# Patient Record
Sex: Female | Born: 1970 | Race: White | Hispanic: No | Marital: Married | Smoking: Never smoker
Health system: Southern US, Community
[De-identification: ages and names within clinical notes are randomized; demographics above are authoritative.]

## PROBLEM LIST (undated history)

## (undated) DIAGNOSIS — F419 Anxiety disorder, unspecified: Secondary | ICD-10-CM

## (undated) DIAGNOSIS — J189 Pneumonia, unspecified organism: Secondary | ICD-10-CM

## (undated) DIAGNOSIS — G43909 Migraine, unspecified, not intractable, without status migrainosus: Secondary | ICD-10-CM

## (undated) DIAGNOSIS — I10 Essential (primary) hypertension: Secondary | ICD-10-CM

## (undated) HISTORY — DX: Migraine, unspecified, not intractable, without status migrainosus: G43.909

## (undated) HISTORY — DX: Essential (primary) hypertension: I10

---

## 2001-01-24 ENCOUNTER — Other Ambulatory Visit: Admission: RE | Admit: 2001-01-24 | Discharge: 2001-01-24 | Payer: Self-pay | Admitting: Obstetrics and Gynecology

## 2001-06-28 ENCOUNTER — Encounter: Admission: RE | Admit: 2001-06-28 | Discharge: 2001-09-26 | Payer: Self-pay | Admitting: Obstetrics and Gynecology

## 2001-07-27 ENCOUNTER — Inpatient Hospital Stay (HOSPITAL_COMMUNITY): Admission: AD | Admit: 2001-07-27 | Discharge: 2001-07-30 | Payer: Self-pay | Admitting: Obstetrics and Gynecology

## 2001-07-27 ENCOUNTER — Ambulatory Visit (HOSPITAL_COMMUNITY): Admission: RE | Admit: 2001-07-27 | Discharge: 2001-07-27 | Payer: Self-pay | Admitting: Obstetrics and Gynecology

## 2001-09-05 ENCOUNTER — Other Ambulatory Visit: Admission: RE | Admit: 2001-09-05 | Discharge: 2001-09-05 | Payer: Self-pay | Admitting: Obstetrics and Gynecology

## 2002-10-17 ENCOUNTER — Other Ambulatory Visit: Admission: RE | Admit: 2002-10-17 | Discharge: 2002-10-17 | Payer: Self-pay | Admitting: Obstetrics and Gynecology

## 2004-01-23 ENCOUNTER — Other Ambulatory Visit: Admission: RE | Admit: 2004-01-23 | Discharge: 2004-01-23 | Payer: Self-pay | Admitting: Obstetrics and Gynecology

## 2005-03-16 ENCOUNTER — Other Ambulatory Visit: Admission: RE | Admit: 2005-03-16 | Discharge: 2005-03-16 | Payer: Self-pay | Admitting: Obstetrics and Gynecology

## 2007-03-31 ENCOUNTER — Ambulatory Visit: Payer: Self-pay | Admitting: Family Medicine

## 2007-03-31 DIAGNOSIS — G43009 Migraine without aura, not intractable, without status migrainosus: Secondary | ICD-10-CM

## 2007-03-31 DIAGNOSIS — I10 Essential (primary) hypertension: Secondary | ICD-10-CM

## 2007-03-31 DIAGNOSIS — R635 Abnormal weight gain: Secondary | ICD-10-CM

## 2007-04-05 LAB — CONVERTED CEMR LAB
Sodium: 140 meq/L (ref 135–145)
TSH: 1.524 microintl units/mL (ref 0.350–5.50)

## 2007-06-09 ENCOUNTER — Ambulatory Visit: Payer: Self-pay | Admitting: Family Medicine

## 2007-06-09 DIAGNOSIS — M2560 Stiffness of unspecified joint, not elsewhere classified: Secondary | ICD-10-CM

## 2007-09-28 ENCOUNTER — Encounter: Payer: Self-pay | Admitting: Family Medicine

## 2007-09-28 LAB — CONVERTED CEMR LAB
Chloride: 108 meq/L
Creatinine, Ser: 0.9 mg/dL
Glucose, Bld: 95 mg/dL
HDL: 66 mg/dL
LDL Cholesterol: 104 mg/dL
Sodium: 144 meq/L

## 2007-09-29 ENCOUNTER — Ambulatory Visit: Payer: Self-pay | Admitting: Family Medicine

## 2007-10-02 ENCOUNTER — Encounter: Payer: Self-pay | Admitting: Family Medicine

## 2008-05-27 ENCOUNTER — Ambulatory Visit: Payer: Self-pay | Admitting: Family Medicine

## 2008-05-27 DIAGNOSIS — M67919 Unspecified disorder of synovium and tendon, unspecified shoulder: Secondary | ICD-10-CM | POA: Insufficient documentation

## 2008-05-27 DIAGNOSIS — M719 Bursopathy, unspecified: Secondary | ICD-10-CM

## 2009-03-04 ENCOUNTER — Telehealth (INDEPENDENT_AMBULATORY_CARE_PROVIDER_SITE_OTHER): Payer: Self-pay | Admitting: *Deleted

## 2009-04-04 ENCOUNTER — Ambulatory Visit: Payer: Self-pay | Admitting: Family Medicine

## 2009-04-04 DIAGNOSIS — J019 Acute sinusitis, unspecified: Secondary | ICD-10-CM

## 2009-04-26 HISTORY — PX: CHOLECYSTECTOMY: SHX55

## 2009-06-18 ENCOUNTER — Ambulatory Visit: Payer: Self-pay | Admitting: Family Medicine

## 2009-08-06 ENCOUNTER — Ambulatory Visit: Payer: Self-pay | Admitting: Family Medicine

## 2009-08-06 DIAGNOSIS — R1011 Right upper quadrant pain: Secondary | ICD-10-CM

## 2009-08-07 LAB — CONVERTED CEMR LAB
Albumin: 4.6 g/dL (ref 3.5–5.2)
Basophils Absolute: 0 10*3/uL (ref 0.0–0.1)
Basophils Relative: 0 % (ref 0–1)
CO2: 27 meq/L (ref 19–32)
Eosinophils Absolute: 0.1 10*3/uL (ref 0.0–0.7)
HCT: 39.1 % (ref 36.0–46.0)
Hemoglobin: 13.2 g/dL (ref 12.0–15.0)
Lipase: 41 units/L (ref 0–75)
Lymphocytes Relative: 28 % (ref 12–46)
Monocytes Relative: 6 % (ref 3–12)
Neutrophils Relative %: 64 % (ref 43–77)
Potassium: 3.9 meq/L (ref 3.5–5.3)
RBC: 4.15 M/uL (ref 3.87–5.11)
RDW: 12.5 % (ref 11.5–15.5)
Sodium: 139 meq/L (ref 135–145)
Total Bilirubin: 0.1 mg/dL — ABNORMAL LOW (ref 0.3–1.2)

## 2009-08-11 ENCOUNTER — Encounter: Admission: RE | Admit: 2009-08-11 | Discharge: 2009-08-11 | Payer: Self-pay | Admitting: Family Medicine

## 2009-08-13 DIAGNOSIS — K8020 Calculus of gallbladder without cholecystitis without obstruction: Secondary | ICD-10-CM | POA: Insufficient documentation

## 2009-08-14 ENCOUNTER — Encounter: Payer: Self-pay | Admitting: Family Medicine

## 2009-08-25 ENCOUNTER — Telehealth: Payer: Self-pay | Admitting: Family Medicine

## 2009-09-29 ENCOUNTER — Encounter: Payer: Self-pay | Admitting: Family Medicine

## 2010-01-03 ENCOUNTER — Ambulatory Visit: Payer: Self-pay | Admitting: Emergency Medicine

## 2010-05-06 ENCOUNTER — Ambulatory Visit
Admission: RE | Admit: 2010-05-06 | Discharge: 2010-05-06 | Payer: Self-pay | Source: Home / Self Care | Attending: Family Medicine | Admitting: Family Medicine

## 2010-05-06 DIAGNOSIS — J069 Acute upper respiratory infection, unspecified: Secondary | ICD-10-CM | POA: Insufficient documentation

## 2010-05-28 NOTE — Consult Note (Signed)
Summary: [facility]   Imported By: Lanelle Bal 09/15/2009 12:29:36  _____________________________________________________________________  External Attachment:    Type:   Image     Comment:   External Document

## 2010-05-28 NOTE — Assessment & Plan Note (Signed)
Summary: sinusitis   Vital Signs:  Patient profile:   40 year old female Height:      66.25 inches Weight:      181 pounds BMI:     29.10 O2 Sat:      100 % on Room air Temp:     99.1 degrees F oral Pulse rate:   82 / minute BP sitting:   128 / 80  (left arm) Cuff size:   regular  Vitals Entered By: Payton Spark CMA (June 18, 2009 11:41 AM)  O2 Flow:  Room air CC: St, cough, HA, congestion and hoarse x 5 days.    Primary Care Provider:  Seymour Bars DO  CC:  St, cough, HA, and congestion and hoarse x 5 days. Marland Kitchen  History of Present Illness: 40 yo WF presents for HA, sore throat, hoarseness.  She has a lot of head congestion.  No fevers or chills.  She has frontal HAs.  Taking Mucinex DM which helps some.  Her cough is dry.  Feels tired.  Has some nausea but no vomitting or diarrhea.  She has some ear pressure and sinus pressure.    Current Medications (verified): 1)  Enalapril-Hydrochlorothiazide 10-25 Mg  Tabs (Enalapril-Hydrochlorothiazide) .Marland Kitchen.. 1 Tab By Mouth Daily 2)  Maxalt-Mlt 5 Mg  Tbdp (Rizatriptan Benzoate) .Marland Kitchen.. 1 Tab By Mouth X 1 As Needed Migraine Headache; May Repeat in 1 Hr X 1 If Needed 3)  Loestrin 24 Fe 1-20 Mg-Mcg Tabs (Norethin Ace-Eth Estrad-Fe) .... Take 1 Tab By Mouth Once Daily  Allergies (verified): No Known Drug Allergies  Past History:  Past Medical History: Reviewed history from 03/31/2007 and no changes required. migraines HTN G4P2 gyn: Dr Candice Camp  Review of Systems      See HPI  Physical Exam  General:  alert, well-developed, well-nourished, and well-hydrated.   Head:  normocephalic and atraumatic.  maxillary sinuses are TTP Eyes:  conjunctiva clear Ears:  EACs patent; TMs translucent and gray with good cone of light and bony landmarks.  Nose:  deviated septum, nasal congestion Mouth:  good dentition and pharynx pink and moist.   laryngitis Neck:  no masses.   Lungs:  Normal respiratory effort, chest expands symmetrically.  Lungs are clear to auscultation, no crackles or wheezes. Heart:  Normal rate and regular rhythm. S1 and S2 normal without gallop, murmur, click, rub or other extra sounds. Skin:  color normal.   Cervical Nodes:  No lymphadenopathy noted   Impression & Recommendations:  Problem # 1:  SINUSITIS - ACUTE-NOS (ICD-461.9) Treat with 5 days of Zithromax along with OTC cold/ sinus meds short term. Continue supportive care measures and call if not improved after 7 days. Her updated medication list for this problem includes:    Zithromax Z-pak 250 Mg Tabs (Azithromycin) ..... Use as directed  Complete Medication List: 1)  Enalapril-hydrochlorothiazide 10-25 Mg Tabs (Enalapril-hydrochlorothiazide) .Marland Kitchen.. 1 tab by mouth daily 2)  Maxalt-mlt 5 Mg Tbdp (Rizatriptan benzoate) .Marland Kitchen.. 1 tab by mouth x 1 as needed migraine headache; may repeat in 1 hr x 1 if needed 3)  Loestrin 24 Fe 1-20 Mg-mcg Tabs (Norethin ace-eth estrad-fe) .... Take 1 tab by mouth once daily 4)  Zithromax Z-pak 250 Mg Tabs (Azithromycin) .... Use as directed  Patient Instructions: 1)  Call if not improved after 7 days. Prescriptions: ZITHROMAX Z-PAK 250 MG TABS (AZITHROMYCIN) use as directed  #1 pack x 0   Entered and Authorized by:   Seymour Bars DO  Signed by:   Seymour Bars DO on 06/18/2009   Method used:   Electronically to        [facility] [phone]* (retail)       [address] [place], [place]  [address]       Ph: [phone]       Fax: [phone]   RxID:   [id number]

## 2010-05-28 NOTE — Assessment & Plan Note (Signed)
Summary: COugh, HA, fever, sinus pressure x 1 wk rm 5   Vital Signs:  Patient Profile:   40 Years Old Female CC:      Cold & URI symptoms Height:     66.25 inches Weight:      177 pounds O2 Sat:      97 % O2 treatment:    Room Air Temp:     100.7 degrees F oral Pulse rate:   121 / minute Pulse rhythm:   irregular Resp:     16 per minute BP sitting:   125 / 80  (left arm) Cuff size:   regular  Vitals Entered By: Areta Haber CMA (January 03, 2010 11:40 AM)                  Current Allergies: No known allergies History of Present Illness History from: patient Chief Complaint: Cold & URI symptoms History of Present Illness: Patient complains of onset of cold symptoms.  Lasting 1 week.  Husband was sick 2 weeks ago.  Mucinex D helps. No sore throat + cough No pleuritic pain No wheezing + nasal congestion + post-nasal drainage + sinus pain/pressure No itchy/red eyes No earache No hemoptysis No SOB + chills/sweats No nausea No vomiting No abdominal pain No diarrhea No skin rashes No fatigue + myalgias No headache   Current Problems: ACUTE SINUSITIS, UNSPECIFIED (ICD-461.9) GALLSTONES (ICD-574.20) RUQ PAIN (ICD-789.01) SINUSITIS - ACUTE-NOS (ICD-461.9) ROTATOR CUFF SYNDROME, RIGHT (ICD-726.10) NECK STIFFNESS (ICD-719.58) COMMON MIGRAINE (ICD-346.10) WEIGHT GAIN (ICD-783.1) ESSENTIAL HYPERTENSION, BENIGN (ICD-401.1)   Current Meds ENALAPRIL-HYDROCHLOROTHIAZIDE 10-25 MG  TABS (ENALAPRIL-HYDROCHLOROTHIAZIDE) 1 tab by mouth daily MAXALT-MLT 5 MG  TBDP (RIZATRIPTAN BENZOATE) 1 tab by mouth x 1 as needed migraine headache; may repeat in 1 hr x 1 if needed LOESTRIN 24 FE 1-20 MG-MCG TABS (NORETHIN ACE-ETH ESTRAD-FE) take 1 tab by mouth once daily NEXIUM 40 MG CPDR (ESOMEPRAZOLE MAGNESIUM) 1 capsule by mouth daily ALKA-SELTZER PLUS COLD & COUGH 08-25-08-325 MG CAPS (PHENYLEPH-CPM-DM-APAP) as directed MUCINEX D 60-600 MG XR12H-TAB  (PSEUDOEPHEDRINE-GUAIFENESIN) as directed ZITHROMAX Z-PAK 250 MG TABS (AZITHROMYCIN) use as directed  REVIEW OF SYSTEMS Constitutional Symptoms       Complains of fever and chills.     Denies night sweats, weight loss, weight gain, and fatigue.  Eyes       Denies change in vision, eye pain, eye discharge, glasses, contact lenses, and eye surgery. Ear/Nose/Throat/Mouth       Complains of sinus problems and hoarseness.      Denies hearing loss/aids, change in hearing, ear pain, ear discharge, dizziness, frequent runny nose, frequent nose bleeds, sore throat, and tooth pain or bleeding.      Comments: X 1 wk Respiratory       Denies dry cough, productive cough, wheezing, shortness of breath, asthma, bronchitis, and emphysema/COPD.  Cardiovascular       Denies murmurs, chest pain, and tires easily with exhertion.    Gastrointestinal       Denies stomach pain, nausea/vomiting, diarrhea, constipation, blood in bowel movements, and indigestion. Genitourniary       Denies painful urination, kidney stones, and loss of urinary control. Neurological       Complains of headaches.      Denies paralysis, seizures, and fainting/blackouts. Musculoskeletal       Complains of muscle pain and joint stiffness.      Denies joint pain, decreased range of motion, redness, swelling, muscle weakness, and gout.  Skin  Denies bruising, unusual mles/lumps or sores, and hair/skin or nail changes.  Psych       Denies mood changes, temper/anger issues, anxiety/stress, speech problems, depression, and sleep problems. Other Comments: Pt has not seen her PCP for this.   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 Married to [place] and has 2 kids. Never smoked. Occas ETOH Has mirena IUD. 2 coffee /  day. Wants to lose wt and start exercising.  Risk Factors: Smoking Status: never (05/27/2008) Physical Exam General appearance: well developed, well nourished, no acute distress.  Appears slightly fatigued. Ears: normal, no lesions or deformities Nasal: clear discharge, mild erythema Oral/Pharynx: clear PND Neck: neck supple,  trachea midline, no masses Chest/Lungs: no rales, wheezes, or rhonchi bilateral, breath sounds equal without effort Heart: regular rate and  rhythm, no murmur Skin: no obvious rashes or lesions MSE: oriented to time, place, and person Assessment New Problems: ACUTE SINUSITIS, UNSPECIFIED (ICD-461.9)   Patient Education: Patient and/or caregiver instructed in the following: rest, fluids, Tylenol prn, Ibuprofen prn.  Plan New Medications/Changes: ZITHROMAX Z-PAK 250 MG TABS (AZITHROMYCIN) use as directed  #1 x 0, 01/03/2010, Hoyt Koch MD  New Orders: New Patient Level II [phone] Planning Comments:   Continue Mucinex-D for another 3-5 days two times a day  Follow-up with your primary care physician if not improving in another 5-7 days or sooner if getting worse   The patient and/or caregiver has been counseled thoroughly with regard to medications prescribed including dosage, schedule, interactions, rationale for use, and possible side effects and they verbalize understanding.  Diagnoses and expected course of recovery discussed and will return if not improved as expected or if the condition worsens. Patient and/or caregiver verbalized understanding.  Prescriptions: ZITHROMAX Z-PAK 250 MG TABS (AZITHROMYCIN) use as directed  #1 x 0   Entered and Authorized by:   Hoyt Koch MD   Signed by:   Hoyt Koch MD on 01/03/2010   Method used:   Printed then faxed to ...       [facility] [phone]* (retail)       [address] [place], [place]  [address]       Ph: [phone]       Fax: [phone]   RxID:    [id number]   Orders Added: 1)  New Patient Level II [02542]

## 2010-05-28 NOTE — Op Note (Signed)
Summary: Cholecystectomy/[facility]  Cholecystectomy/[facility]   Imported By: Lanelle Bal 10/09/2009 11:17:11  _____________________________________________________________________  External Attachment:    Type:   Image     Comment:   External Document

## 2010-05-28 NOTE — Assessment & Plan Note (Signed)
Summary: GALLSTONES   Vital Signs:  Patient profile:   40 year old female Height:      66.25 inches Weight:      175 pounds BMI:     28.13 O2 Sat:      100 % on Room air Temp:     98.8 degrees F oral Pulse rate:   72 / minute BP sitting:   126 / 72  (left arm) Cuff size:   large  Vitals Entered By: Payton Spark CMA (August 06, 2009 2:54 PM)  O2 Flow:  Room air CC: ? Gallstones. Nausea after eating and RUQ pain x 2 years.    Primary Care Provider:  Seymour Bars DO  CC:  ? Gallstones. Nausea after eating and RUQ pain x 2 years. .  History of Present Illness: 40 yo WF presents for ? gallstones.  Over the past couple years, she has had some postprandial RUQ pain with nausea.  This has been intermittent.  This past wk, she has been having RUQ pain in the mornings.  She did an u/s at work this week.  (She works at a urology office.)  She had some stones in the gall bladder.  Denies any diarrhea or vomitting.  Denies fevers.  Denies any wt loss.      Current Medications (verified): 1)  Enalapril-Hydrochlorothiazide 10-25 Mg  Tabs (Enalapril-Hydrochlorothiazide) .Marland Kitchen.. 1 Tab By Mouth Daily 2)  Maxalt-Mlt 5 Mg  Tbdp (Rizatriptan Benzoate) .Marland Kitchen.. 1 Tab By Mouth X 1 As Needed Migraine Headache; May Repeat in 1 Hr X 1 If Needed 3)  Loestrin 24 Fe 1-20 Mg-Mcg Tabs (Norethin Ace-Eth Estrad-Fe) .... Take 1 Tab By Mouth Once Daily  Allergies (verified): No Known Drug Allergies  Past History:  Past Medical History: Reviewed history from 03/31/2007 and no changes required. migraines HTN G4P2 gyn: Dr Candice Camp  Past Surgical History: Reviewed history from 03/31/2007 and no changes required. none  Social History: Reviewed history from 03/31/2007 and no changes required. 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 Never smoked. Occas ETOH Has mirena IUD. 2 coffee / day. Wants to lose wt and start exercising.  Review of Systems      See  HPI  Physical Exam  General:  alert, well-developed, well-nourished, and well-hydrated.   Head:  normocephalic and atraumatic.   Eyes:  sclera non icteric Mouth:  good dentition and pharynx pink and moist.   Neck:  no masses.   Lungs:  Normal respiratory effort, chest expands symmetrically. Lungs are clear to auscultation, no crackles or wheezes. Heart:  Normal rate and regular rhythm. S1 and S2 normal without gallop, murmur, click, rub or other extra sounds. Abdomen:  + murphys sign with RUQ tenderness and guarding. soft.  ND.   Extremities:  no LE edema Skin:  color normal and no rashes.   Psych:  good eye contact, not anxious appearing, and not depressed appearing.     Impression & Recommendations:  Problem # 1:  RUQ PAIN (ICD-789.01) Likely cholecystitis.  Labs obtained today.  F/U results tomorrow.  Refer to Gen surgery if + given findings on in - office u/s at her workplace.  Avoid fatty greasy foods in the interum.  Start Nexium samples for dyspepsia. Orders: T-Comprehensive Metabolic Panel [id number]) T-CBC w/Diff [id number]) T-Amylase [id number]) T-Lipase [id number])  Complete Medication List: 1)  Enalapril-hydrochlorothiazide 10-25 Mg Tabs (Enalapril-hydrochlorothiazide) .Marland Kitchen.. 1 tab by mouth daily 2)  Maxalt-mlt 5 Mg Tbdp (Rizatriptan benzoate) .Marland KitchenMarland KitchenMarland Kitchen 1  tab by mouth x 1 as needed migraine headache; may repeat in 1 hr x 1 if needed 3)  Loestrin 24 Fe 1-20 Mg-mcg Tabs (Norethin ace-eth estrad-fe) .... Take 1 tab by mouth once daily  Patient Instructions: 1)  LABS TODAY. 2)  AVOID FATTY GREASY FOODS. 3)  WILL CALL YOU W/ RESULTS TOMORROW.

## 2010-05-28 NOTE — Progress Notes (Signed)
Summary: needs Nexium Rx. called in  Phone Note Call from Patient   Caller: Patient Summary of Call: Call patient at [phone] Ext: [phone] Patient was given Nexium samples by Dr.Maisen Klingler and now pt. is requesting a RX. for Nexium. Call her back and let her know the status on this. Thanks, Michaelle Copas Initial call taken by: Michaelle Copas,  Aug 25, 2009 10:20 AM    New/Updated Medications: NEXIUM 40 MG CPDR (ESOMEPRAZOLE MAGNESIUM) 1 capsule by mouth daily Prescriptions: NEXIUM 40 MG CPDR (ESOMEPRAZOLE MAGNESIUM) 1 capsule by mouth daily  #30 x 2   Entered and Authorized by:   Seymour Bars DO   Signed by:   Seymour Bars DO on 08/25/2009   Method used:   Electronically to        [facility] [phone]* (retail)       [address]       [place], [place]  [address]       Ph: [phone]       Fax: [phone]   RxID:   [id number]   Appended Document: needs Nexium Rx. called in Pt aware

## 2010-05-28 NOTE — Assessment & Plan Note (Signed)
Summary: URI   Vital Signs:  Patient profile:   40 year old female Height:      66.25 inches Weight:      180 pounds BMI:     28.94 O2 Sat:      99 % on Room air Temp:     98.5 degrees F oral Pulse rate:   95 / minute BP sitting:   123 / 81  (left arm) Cuff size:   regular  Vitals Entered By: Payton Spark CMA (May 06, 2010 3:48 PM)  O2 Flow:  Room air CC: Cough, congestion, drainage and ST x 4 days.   Primary Care Provider:  Seymour Bars DO  CC:  Cough, congestion, and drainage and ST x 4 days.Marland Kitchen  History of Present Illness: 40 yo WF presents for sore throat, laryngitis, congestion and cough.  Taking Mucinex DM which is helping some.  Denies fevers or chills.  She had her flu shot this year.  Has some sinus pain.  This all started 4 days ago.  HEr cough is mostly dry.  No CP or DOE.  The cough is keeping her up at night.  Denies any GI upset.  No sign contcts.    Current Medications (verified): 1)  Enalapril-Hydrochlorothiazide 10-25 Mg  Tabs (Enalapril-Hydrochlorothiazide) .Marland Kitchen.. 1 Tab By Mouth Daily 2)  Maxalt-Mlt 5 Mg  Tbdp (Rizatriptan Benzoate) .Marland Kitchen.. 1 Tab By Mouth X 1 As Needed Migraine Headache; May Repeat in 1 Hr X 1 If Needed 3)  Nexium 40 Mg Cpdr (Esomeprazole Magnesium) .Marland Kitchen.. 1 Capsule By Mouth Daily  Allergies (verified): No Known Drug Allergies  Past History:  Past Medical History: Reviewed history from 03/31/2007 and no changes required. migraines HTN G4P2 gyn: Dr Candice Camp  Past Surgical History: Reviewed history from 03/31/2007 and no changes required. none  Social History: Reviewed history from 03/31/2007 and no changes required. 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 Never smoked. Occas ETOH Has mirena IUD. 2 coffee / day. Wants to lose wt and start exercising.  Review of Systems      See HPI  Physical Exam  General:  alert, well-developed, well-nourished, and well-hydrated.   Head:   normocephalic and atraumatic.  sinuses NTTP Eyes:  conjunctiva clear Ears:  EACs patent; TMs translucent and gray with good cone of light and bony landmarks.  Nose:  boggy turbinates; clear rhinorrhea Mouth:  o/p slighly injected, clear postnasal drip Neck:  no masses.   Lungs:  Normal respiratory effort, chest expands symmetrically. Lungs are clear to auscultation, no crackles or wheezes. Heart:  Normal rate and regular rhythm. S1 and S2 normal without gallop, murmur, click, rub or other extra sounds. Skin:  color normal.   Cervical Nodes:  shoddy anterior cervical chain LNs Psych:  good eye contact, not anxious appearing, and not depressed appearing.     Impression & Recommendations:  Problem # 1:  VIRAL URI (ICD-465.9) Supportive care for viral URI, day 4.  Added Tussionex at night. The following medications were removed from the medication list:    Alka-seltzer Plus Cold & Cough 08-25-08-325 Mg Caps (Phenyleph-cpm-dm-apap) .Marland Kitchen... As directed    Mucinex D 60-600 Mg Xr12h-tab (Pseudoephedrine-guaifenesin) .Marland Kitchen... As directed Her updated medication list for this problem includes:    Tussionex Pennkinetic Er 10-8 Mg/41ml Lqcr (Hydrocod polst-chlorphen polst) .Marland KitchenMarland KitchenMarland KitchenMarland Kitchen 5 ml by mouth at bedtime as needed cough  Problem # 2:  ESSENTIAL HYPERTENSION, BENIGN (ICD-401.1) BP med RF.  Labs due the end  of April.  She will f/u then.  BP at goal.   Her updated medication list for this problem includes:    Enalapril-hydrochlorothiazide 10-25 Mg Tabs (Enalapril-hydrochlorothiazide) .Marland Kitchen... 1 tab by mouth daily  BP today: 123/81 Prior BP: 125/80 (01/03/2010)  Prior 10 Yr Risk Heart Disease: 2 % (04/04/2009)  Labs Reviewed: K+: 3.9 (08/06/2009) Creat: : 0.80 (08/06/2009)   Chol: 190 (09/28/2007)   HDL: 66 (09/28/2007)   LDL: 104 (09/28/2007)   TG: 115 (09/28/2007)  Complete Medication List: 1)  Enalapril-hydrochlorothiazide 10-25 Mg Tabs (Enalapril-hydrochlorothiazide) .Marland Kitchen.. 1 tab by mouth daily 2)   Maxalt-mlt 5 Mg Tbdp (Rizatriptan benzoate) .Marland Kitchen.. 1 tab by mouth x 1 as needed migraine headache; may repeat in 1 hr x 1 if needed 3)  Nexium 40 Mg Cpdr (Esomeprazole magnesium) .Marland Kitchen.. 1 capsule by mouth daily 4)  Tussionex Pennkinetic Er 10-8 Mg/73ml Lqcr (Hydrocod polst-chlorphen polst) .... 5 ml by mouth at bedtime as needed cough  Patient Instructions: 1)  For cold symptoms: 2)  Use Mucinex DM + Sudafed for cough and congestion during the day. 3)  Instead of the nighttime dose of Mucinex DM, use Tussionex. 4)  Call if not improved after 7 days. Prescriptions: ENALAPRIL-HYDROCHLOROTHIAZIDE 10-25 MG  TABS (ENALAPRIL-HYDROCHLOROTHIAZIDE) 1 tab by mouth daily  #30 Tablet x 2   Entered and Authorized by:   Seymour Bars DO   Signed by:   Seymour Bars DO on 05/06/2010   Method used:   Electronically to        [facility] [phone]* (retail)       [address] [place], [place]  [address]       Ph: [phone]       Fax: [phone]   RxID:   [id number] Sandria Senter ER 10-8 MG/5ML LQCR (HYDROCOD POLST-CHLORPHEN POLST) 5 ml by mouth at bedtime as needed cough  #100 ml x 0   Entered and Authorized by:   Seymour Bars DO   Signed by:   Seymour Bars DO on 05/06/2010   Method used:   Printed then faxed to ...       [facility] [phone]* (retail)       [address] [place], [place]  [address]       Ph: [phone]       Fax: [phone]   RxID:   [id number]    Orders Added: 1)  Est. Patient Level III [32[address]]

## 2010-05-28 NOTE — Letter (Signed)
Summary: Out of Work  [facility]  [address]   [place], [place] [address]   Phone: [phone]  Fax: [phone]    June 18, 2009   Employee:  Darlene Melendez    To Whom It May Concern:   For Medical reasons, please excuse the above named employee from work for the following dates:  Start:   Feb 22 - 23rd  End:   Feb 24th  If you need additional information, please feel free to contact our office.         Sincerely,    Seymour Bars DO

## 2010-07-16 ENCOUNTER — Other Ambulatory Visit: Payer: Self-pay | Admitting: Obstetrics and Gynecology

## 2010-07-16 DIAGNOSIS — R928 Other abnormal and inconclusive findings on diagnostic imaging of breast: Secondary | ICD-10-CM

## 2010-07-23 ENCOUNTER — Ambulatory Visit
Admission: RE | Admit: 2010-07-23 | Discharge: 2010-07-23 | Disposition: A | Discharge status: Home / Self Care | Payer: PRIVATE HEALTH INSURANCE | Source: Ambulatory Visit | Attending: Obstetrics and Gynecology | Admitting: Obstetrics and Gynecology

## 2010-07-23 DIAGNOSIS — R928 Other abnormal and inconclusive findings on diagnostic imaging of breast: Secondary | ICD-10-CM

## 2010-08-26 ENCOUNTER — Telehealth: Payer: Self-pay | Admitting: *Deleted

## 2010-08-26 NOTE — Telephone Encounter (Signed)
Pt would like to have labs done prior to 09/23/09 CPE. Please advise.

## 2010-08-27 NOTE — Telephone Encounter (Signed)
Venice, please arrive pt for LAB visit tomororw and I will print out her lab order.  She can go anytime next wk to the lab fasting to have them drawn.

## 2010-08-28 ENCOUNTER — Other Ambulatory Visit: Payer: PRIVATE HEALTH INSURANCE

## 2010-09-20 ENCOUNTER — Encounter: Payer: Self-pay | Admitting: Family Medicine

## 2010-09-24 ENCOUNTER — Ambulatory Visit (INDEPENDENT_AMBULATORY_CARE_PROVIDER_SITE_OTHER): Payer: PRIVATE HEALTH INSURANCE | Admitting: Family Medicine

## 2010-09-24 ENCOUNTER — Encounter: Payer: Self-pay | Admitting: Family Medicine

## 2010-09-24 DIAGNOSIS — Z13220 Encounter for screening for lipoid disorders: Secondary | ICD-10-CM

## 2010-09-24 DIAGNOSIS — I10 Essential (primary) hypertension: Secondary | ICD-10-CM

## 2010-09-24 DIAGNOSIS — Z130 Encounter for screening for diseases of the blood and blood-forming organs and certain disorders involving the immune mechanism: Secondary | ICD-10-CM

## 2010-09-24 MED ORDER — ENALAPRIL-HYDROCHLOROTHIAZIDE 10-25 MG PO TABS: 1.0000 | ORAL_TABLET | Freq: Every day | ORAL | Status: DC

## 2010-09-24 NOTE — Patient Instructions (Signed)
Update fasting labs. Will call you w/ results on Monday.  Keep up the good work.  Return in 6 mos for f/u.

## 2010-09-24 NOTE — Assessment & Plan Note (Signed)
BP looks great on current meds, RFd today.  Update fasting labs, work on adding regular exercise and return in 6 mos.

## 2010-09-24 NOTE — Progress Notes (Signed)
  Subjective:    Patient ID: Darlene Melendez, female    DOB: [birth year], 40 y.o.   MRN: [id number]  HPI 40 yo WF presents for f/u HTN.  She is due for fasting labs.  She is on enalapril/ HCTZ once daily and is doing well on it w/o problems.  She eats healthy and plans to start exercising.  Denies vision changes, HAs, edema.    BP 113/69  Pulse 76  Resp 20  Ht 5\' 6"  (1.676 m)  Wt 177 lb (80.287 kg)  BMI 28.57 kg/m2  SpO2 98%  Review of Systems as her HPI    Objective:   Physical Exam  Constitutional: She appears well-developed and well-nourished. No distress.  Eyes: Pupils are equal, round, and reactive to light.  Neck: Neck supple. No thyromegaly present.  Cardiovascular: Normal rate, regular rhythm and normal heart sounds.   No murmur heard. Pulmonary/Chest: Effort normal and breath sounds normal.  Musculoskeletal: She exhibits no edema.  Skin: Skin is warm and dry.  Psychiatric: She has a normal mood and affect.          Assessment & Plan:

## 2010-09-25 ENCOUNTER — Telehealth: Payer: Self-pay | Admitting: Family Medicine

## 2010-09-25 LAB — CBC WITH DIFFERENTIAL/PLATELET
Basophils Absolute: 0 10*3/uL (ref 0.0–0.1)
Basophils Relative: 0 % (ref 0–1)
Hemoglobin: 12.4 g/dL (ref 12.0–15.0)
Lymphocytes Relative: 36 % (ref 12–46)
Lymphs Abs: 1.9 10*3/uL (ref 0.7–4.0)
MCHC: 32.5 g/dL (ref 30.0–36.0)
Monocytes Absolute: 0.3 10*3/uL (ref 0.1–1.0)
Neutro Abs: 3 10*3/uL (ref 1.7–7.7)
Neutrophils Relative %: 56 % (ref 43–77)
Platelets: 243 10*3/uL (ref 150–400)
RDW: 13.1 % (ref 11.5–15.5)

## 2010-09-25 LAB — COMPLETE METABOLIC PANEL WITH GFR
AST: 13 U/L (ref 0–37)
Albumin: 4.4 g/dL (ref 3.5–5.2)
Alkaline Phosphatase: 57 U/L (ref 39–117)
Calcium: 9.3 mg/dL (ref 8.4–10.5)
Creat: 0.81 mg/dL (ref 0.40–1.20)
GFR, Est African American: >60 mL/min (ref >60)
Total Bilirubin: 0.4 mg/dL (ref 0.3–1.2)

## 2010-09-25 LAB — LIPID PANEL
Cholesterol: 205 mg/dL — ABNORMAL HIGH (ref 0–200)
HDL: 68 mg/dL (ref >39)
LDL Cholesterol: 111 mg/dL — ABNORMAL HIGH (ref 0–99)
Triglycerides: 130 mg/dL (ref <150)

## 2010-09-25 NOTE — Telephone Encounter (Signed)
Pls let pt know that her blood counts, sugar, liver and kidney function came back normal.  Cholesterol is at goal.  Repeat in 1 yr.

## 2010-09-25 NOTE — Telephone Encounter (Signed)
Pt aware of the above  

## 2010-09-28 ENCOUNTER — Telehealth: Payer: Self-pay | Admitting: Family Medicine

## 2010-09-28 MED ORDER — ENALAPRIL-HYDROCHLOROTHIAZIDE 10-25 MG PO TABS: 1.0000 | ORAL_TABLET | Freq: Every day | ORAL | Status: DC

## 2010-09-28 NOTE — Telephone Encounter (Signed)
Resent RX

## 2010-12-07 ENCOUNTER — Other Ambulatory Visit: Payer: Self-pay | Admitting: *Deleted

## 2010-12-07 MED ORDER — ENALAPRIL-HYDROCHLOROTHIAZIDE 10-25 MG PO TABS: 1.0000 | ORAL_TABLET | Freq: Every day | ORAL | Status: DC

## 2010-12-24 ENCOUNTER — Other Ambulatory Visit: Payer: Self-pay | Admitting: Obstetrics and Gynecology

## 2010-12-24 DIAGNOSIS — N6310 Unspecified lump in the right breast, unspecified quadrant: Secondary | ICD-10-CM

## 2011-01-18 ENCOUNTER — Ambulatory Visit
Admission: RE | Admit: 2011-01-18 | Discharge: 2011-01-18 | Disposition: A | Discharge status: Home / Self Care | Payer: PRIVATE HEALTH INSURANCE | Source: Ambulatory Visit | Attending: Obstetrics and Gynecology | Admitting: Obstetrics and Gynecology

## 2011-01-18 DIAGNOSIS — N6310 Unspecified lump in the right breast, unspecified quadrant: Secondary | ICD-10-CM

## 2011-06-14 ENCOUNTER — Encounter: Payer: Self-pay | Admitting: *Deleted

## 2011-06-14 ENCOUNTER — Emergency Department (INDEPENDENT_AMBULATORY_CARE_PROVIDER_SITE_OTHER)
Admission: EM | Admit: 2011-06-14 | Discharge: 2011-06-14 | Disposition: A | Discharge status: Home Health | Payer: PRIVATE HEALTH INSURANCE | Source: Home / Self Care | Attending: Emergency Medicine | Admitting: Emergency Medicine

## 2011-06-14 DIAGNOSIS — J329 Chronic sinusitis, unspecified: Secondary | ICD-10-CM

## 2011-06-14 DIAGNOSIS — J069 Acute upper respiratory infection, unspecified: Secondary | ICD-10-CM

## 2011-06-14 HISTORY — DX: Anxiety disorder, unspecified: F41.9

## 2011-06-14 MED ORDER — AMOXICILLIN-POT CLAVULANATE 875-125 MG PO TABS: 1.0000 | ORAL_TABLET | Freq: Two times a day (BID) | ORAL | Status: AC

## 2011-06-14 NOTE — ED Notes (Signed)
Patient c/o productive cough, sinus pain and drainage x 4 weeks. Taken mucinex and alka seltzer OTC.

## 2011-06-14 NOTE — ED Provider Notes (Signed)
History     CSN: [id number]  Arrival date & time 06/14/11  1609   First MD Initiated Contact with Patient 06/14/11 1611      Chief Complaint  Patient presents with  . Sinusitis  . Cough    (Consider location/radiation/quality/duration/timing/severity/associated sxs/prior treatment) HPI Darlene Melendez is a 41 y.o. female who complains of onset of cold symptoms for 4 weeks.  No sore throat No cough No pleuritic pain No wheezing + nasal congestion + post-nasal drainage + sinus pain/pressure No chest congestion No itchy/red eyes No earache No hemoptysis No SOB No chills/sweats + fever No nausea No vomiting No abdominal pain No diarrhea No skin rashes No fatigue No myalgias + headache    Past Medical History  Diagnosis Date  . Migraines   . Hypertension   . Anxiety     Past Surgical History  Procedure Date  . Cholecystectomy 2011    Family History  Problem Relation Age of Onset  . Hypertension Mother   . Hypertension Father   . Diabetes Father   . Cancer Father     History  Substance Use Topics  . Smoking status: Never Smoker   . Smokeless tobacco: Not on file  . Alcohol Use: Yes     occasional    OB History    Grav Para Term Preterm Abortions TAB SAB Ect Mult Living                  Review of Systems  All other systems reviewed and are negative.    Allergies  Review of patient's allergies indicates no known allergies.  Home Medications   Current Outpatient Rx  Name Route Sig Dispense Refill  . ALPRAZOLAM 0.25 MG PO TABS Oral Take 0.25 mg by mouth at bedtime as needed.      . AMOXICILLIN-POT CLAVULANATE 875-125 MG PO TABS Oral Take 1 tablet by mouth 2 (two) times daily. 20 tablet 0  . HYDROCOD POLST-CPM POLST ER 10-8 MG/5ML PO LQCR Oral Take 5 mLs by mouth at bedtime as needed. For cough.     . ENALAPRIL-HYDROCHLOROTHIAZIDE 10-25 MG PO TABS Oral Take 1 tablet by mouth daily. 90 tablet 3  . ESCITALOPRAM OXALATE 10 MG PO TABS Oral Take 10  mg by mouth daily.      Marland Kitchen ESOMEPRAZOLE MAGNESIUM 40 MG PO CPDR Oral Take 40 mg by mouth daily before breakfast.      . ESTRADIOL 2 MG PO TABS Oral Take 2 mg by mouth daily.      Marland Kitchen RIZATRIPTAN BENZOATE 5 MG PO TBDP Oral Take 5 mg by mouth as needed. May repeat in 1-2 hours if needed if no relief from headache.       BP 118/79  Pulse 80  Temp(Src) 99.4 F (37.4 C) (Oral)  Resp 14  Ht 5\' 6"  (1.676 m)  Wt 185 lb (83.915 kg)  BMI 29.86 kg/m2  SpO2 99%  Physical Exam  Nursing note and vitals reviewed. Constitutional: She is oriented to person, place, and time. She appears well-developed and well-nourished.  HENT:  Head: Normocephalic and atraumatic.  Right Ear: Tympanic membrane, external ear and ear canal normal.  Left Ear: Tympanic membrane, external ear and ear canal normal.  Nose: Mucosal edema and rhinorrhea present. Right sinus exhibits maxillary sinus tenderness. Left sinus exhibits maxillary sinus tenderness.  Mouth/Throat: No oropharyngeal exudate, posterior oropharyngeal edema or posterior oropharyngeal erythema.  Eyes: No scleral icterus.  Neck: Neck supple.  Cardiovascular: Regular rhythm and normal heart  sounds.   Pulmonary/Chest: Effort normal and breath sounds normal. No respiratory distress.  Neurological: She is alert and oriented to person, place, and time.  Skin: Skin is warm and dry.  Psychiatric: She has a normal mood and affect. Her speech is normal.    ED Course  Procedures (including critical care time)  Labs Reviewed - No data to display No results found.   1. Acute upper respiratory infections of unspecified site   2. Sinusitis       MDM  1)  Take the prescribed antibiotic as instructed. 2)  Use nasal saline solution (over the counter) at least 3 times a day. 3)  Use over the counter decongestants like Zyrtec-D every 12 hours as needed to help with congestion.  If you have hypertension, do not take medicines with sudafed.  4)  Can take tylenol  every 6 hours or motrin every 8 hours for pain or fever. 5)  Follow up with your primary doctor if no improvement in 5-7 days, sooner if increasing pain, fever, or new symptoms.      Lily Kocher, MD 06/14/11 [phone]

## 2011-07-22 ENCOUNTER — Other Ambulatory Visit: Payer: Self-pay | Admitting: Obstetrics and Gynecology

## 2011-07-22 DIAGNOSIS — Z1231 Encounter for screening mammogram for malignant neoplasm of breast: Secondary | ICD-10-CM

## 2011-08-05 ENCOUNTER — Ambulatory Visit
Admission: RE | Admit: 2011-08-05 | Discharge: 2011-08-05 | Disposition: A | Discharge status: Home / Self Care | Payer: PRIVATE HEALTH INSURANCE | Source: Ambulatory Visit | Attending: Obstetrics and Gynecology | Admitting: Obstetrics and Gynecology

## 2011-08-05 DIAGNOSIS — Z1231 Encounter for screening mammogram for malignant neoplasm of breast: Secondary | ICD-10-CM

## 2011-08-09 ENCOUNTER — Other Ambulatory Visit: Payer: Self-pay | Admitting: Obstetrics and Gynecology

## 2011-08-09 DIAGNOSIS — R928 Other abnormal and inconclusive findings on diagnostic imaging of breast: Secondary | ICD-10-CM

## 2011-08-11 ENCOUNTER — Ambulatory Visit
Admission: RE | Admit: 2011-08-11 | Discharge: 2011-08-11 | Disposition: A | Discharge status: Home / Self Care | Payer: PRIVATE HEALTH INSURANCE | Source: Ambulatory Visit | Attending: Obstetrics and Gynecology | Admitting: Obstetrics and Gynecology

## 2011-08-11 DIAGNOSIS — R928 Other abnormal and inconclusive findings on diagnostic imaging of breast: Secondary | ICD-10-CM

## 2011-09-15 ENCOUNTER — Other Ambulatory Visit: Payer: Self-pay | Admitting: Obstetrics and Gynecology

## 2011-09-29 ENCOUNTER — Encounter: Payer: Self-pay | Admitting: Physician Assistant

## 2011-09-29 ENCOUNTER — Ambulatory Visit (INDEPENDENT_AMBULATORY_CARE_PROVIDER_SITE_OTHER): Payer: PRIVATE HEALTH INSURANCE | Admitting: Physician Assistant

## 2011-09-29 VITALS — BP 131/86 | HR 73 | Temp 98.3°F | Ht 66.0 in | Wt 183.0 lb

## 2011-09-29 DIAGNOSIS — G43009 Migraine without aura, not intractable, without status migrainosus: Secondary | ICD-10-CM

## 2011-09-29 MED ORDER — SUMATRIPTAN SUCCINATE 100 MG PO TABS: 100.0000 mg | ORAL_TABLET | ORAL | Status: DC | PRN

## 2011-09-29 NOTE — Patient Instructions (Addendum)
Gave Toradol in office. Continue to take Zofran for nausea. Will give prescription for Imitrex since not tried. Take at onset and then can take another pill 2 hr later if migraine still present. No more more 2 doses in 24 hrs. Make sure to stay hydrated.   Migraine Headache A migraine headache is an intense, throbbing pain on one or both sides of your head. The exact cause of a migraine headache is not always known. A migraine may be caused when nerves in the brain become irritated and release chemicals that cause swelling within blood vessels, causing pain. Many migraine sufferers have a family history of migraines. Before you get a migraine you may or may not get an aura. An aura is a group of symptoms that can predict the beginning of a migraine. An aura may include:  Visual changes such as:   Flashing lights.   Bright spots or zig-zag lines.   Tunnel vision.   Feelings of numbness.   Trouble talking.   Muscle weakness.  SYMPTOMS  Pain on one or both sides of your head.   Pain that is pulsating or throbbing in nature.   Pain that is severe enough to prevent daily activities.   Pain that is aggravated by any daily physical activity.   Nausea (feeling sick to your stomach), vomiting, or both.   Pain with exposure to bright lights, loud noises, or activity.   General sensitivity to bright lights or loud noises.  MIGRAINE TRIGGERS Examples of triggers of migraine headaches include:   Alcohol.   Smoking.   Stress.   It may be related to menses (female menstruation).   Aged cheeses.   Foods or drinks that contain nitrates, glutamate, aspartame, or tyramine.   Lack of sleep.   Chocolate.   Caffeine.   Hunger.   Medications such as nitroglycerine (used to treat chest pain), birth control pills, estrogen, and some blood pressure medications.  DIAGNOSIS  A migraine headache is often diagnosed based on:  Symptoms.   Physical examination.   A computerized X-ray  scan (computed tomography, CT) of your head.  TREATMENT  Medications can help prevent migraines if they are recurrent or should they become recurrent. Your caregiver can help you with a medication or treatment program that will be helpful to you.   Lying down in a dark, quiet room may be helpful.   Keeping a headache diary may help you find a trend as to what may be triggering your headaches.  SEEK IMMEDIATE MEDICAL CARE IF:   You have confusion, personality changes or seizures.   You have headaches that wake you from sleep.   You have an increased frequency in your headaches.   You have a stiff neck.   You have a loss of vision.   You have muscle weakness.   You start losing your balance or have trouble walking.   You feel faint or pass out.  MAKE SURE YOU:   Understand these instructions.   Will watch your condition.   Will get help right away if you are not doing well or get worse.  Document Released: 04/12/2005 Document Revised: 04/01/2011 Document Reviewed: 11/26/2008 [facility] Patient Information 2012 [place], [place].

## 2011-09-29 NOTE — Progress Notes (Signed)
  Subjective:    Patient ID: Darlene Melendez, female    DOB: 1970-09-04, 41 y.o.   MRN: [id number]  HPI Darlene Melendez to ER on Saturday night for migraine. CT and LP were negative. She had reglan in her IV. Her migraine has still not resolved. She does not have any meds for migraines. She used to used maxalt but states that it really doesn't work. She has not tried anything else. Usually excedrin migraine to keep off migraines. Not worked this time. Denies auras. Pain is frontal and behind both eyes. She is very sensitive to light. Very nauseated but no vomiting. She has been using Zofran and it is helping.     Review of Systems     Objective:   Physical Exam  Constitutional: She is oriented to person, place, and time. She appears well-developed and well-nourished.  HENT:  Head: Normocephalic and atraumatic.       Pain with palpation in frontal region of head.  Cardiovascular: Normal rate, regular rhythm and normal heart sounds.   Pulmonary/Chest: Effort normal and breath sounds normal.  Neurological: She is alert and oriented to person, place, and time.  Skin: Skin is warm and dry.  Psychiatric: She has a normal mood and affect. Her behavior is normal.          Assessment & Plan:  Common Migraine-Gave Toradol in office. Continue to take Zofran for nausea. Will give prescription for Imitrex since not tried. Take at onset and then can take another pill 2 hr later if migraine still present. No more more 2 doses in 24 hrs. Make sure to stay hydrated.

## 2011-12-22 ENCOUNTER — Other Ambulatory Visit: Payer: Self-pay | Admitting: *Deleted

## 2011-12-22 MED ORDER — ENALAPRIL-HYDROCHLOROTHIAZIDE 10-25 MG PO TABS: 1.0000 | ORAL_TABLET | Freq: Every day | ORAL | Status: DC

## 2012-03-24 ENCOUNTER — Other Ambulatory Visit: Payer: Self-pay | Admitting: Physician Assistant

## 2012-05-01 ENCOUNTER — Other Ambulatory Visit: Payer: Self-pay | Admitting: Physician Assistant

## 2012-05-01 NOTE — Telephone Encounter (Signed)
Needs appointment

## 2012-07-15 ENCOUNTER — Other Ambulatory Visit: Payer: Self-pay | Admitting: Physician Assistant

## 2012-08-04 ENCOUNTER — Ambulatory Visit (INDEPENDENT_AMBULATORY_CARE_PROVIDER_SITE_OTHER): Payer: PRIVATE HEALTH INSURANCE | Admitting: Physician Assistant

## 2012-08-04 ENCOUNTER — Ambulatory Visit: Payer: PRIVATE HEALTH INSURANCE | Admitting: Physician Assistant

## 2012-08-04 ENCOUNTER — Encounter: Payer: Self-pay | Admitting: Physician Assistant

## 2012-08-04 VITALS — BP 116/74 | HR 64 | Wt 190.0 lb

## 2012-08-04 DIAGNOSIS — R5383 Other fatigue: Secondary | ICD-10-CM

## 2012-08-04 DIAGNOSIS — G43009 Migraine without aura, not intractable, without status migrainosus: Secondary | ICD-10-CM

## 2012-08-04 DIAGNOSIS — I10 Essential (primary) hypertension: Secondary | ICD-10-CM

## 2012-08-04 DIAGNOSIS — Z131 Encounter for screening for diabetes mellitus: Secondary | ICD-10-CM

## 2012-08-04 DIAGNOSIS — Z13220 Encounter for screening for lipoid disorders: Secondary | ICD-10-CM

## 2012-08-04 LAB — LIPID PANEL
HDL: 55 mg/dL (ref >39)
LDL Cholesterol: 150 mg/dL — ABNORMAL HIGH (ref 0–99)
Triglycerides: 184 mg/dL — ABNORMAL HIGH (ref <150)
VLDL: 37 mg/dL (ref 0–40)

## 2012-08-04 LAB — CBC
MCV: 90.1 fL (ref 78.0–100.0)
Platelets: 240 10*3/uL (ref 150–400)
RBC: 4.15 MIL/uL (ref 3.87–5.11)
WBC: 5.5 10*3/uL (ref 4.0–10.5)

## 2012-08-04 LAB — COMPREHENSIVE METABOLIC PANEL
ALT: 21 U/L (ref 0–35)
CO2: 31 mEq/L (ref 19–32)
Creat: 0.82 mg/dL (ref 0.50–1.10)
Total Bilirubin: 0.5 mg/dL (ref 0.3–1.2)

## 2012-08-04 LAB — VITAMIN B12: Vitamin B-12: 403 pg/mL (ref 211–911)

## 2012-08-04 LAB — TSH: TSH: 1.531 u[IU]/mL (ref 0.350–4.500)

## 2012-08-04 MED ORDER — ENALAPRIL-HYDROCHLOROTHIAZIDE 10-25 MG PO TABS: 1.0000 | ORAL_TABLET | Freq: Every day | ORAL | Status: DC

## 2012-08-04 MED ORDER — SUMATRIPTAN SUCCINATE 100 MG PO TABS: 100.0000 mg | ORAL_TABLET | ORAL | Status: DC | PRN

## 2012-08-05 LAB — VITAMIN D 25 HYDROXY (VIT D DEFICIENCY, FRACTURES): Vit D, 25-Hydroxy: 33 ng/mL (ref 30–89)

## 2012-08-06 NOTE — Progress Notes (Signed)
  Subjective:    Patient ID: Darlene Melendez, female    DOB: 05-03-1970, 42 y.o.   MRN: [id number]  Hypertension This is a chronic problem. The current episode started more than 1 year ago. The problem is unchanged. The problem is controlled. Pertinent negatives include no anxiety, blurred vision, chest pain, headaches, malaise/fatigue, neck pain, orthopnea, palpitations, peripheral edema, PND, shortness of breath or sweats. There are no associated agents to hypertension. Risk factors for coronary artery disease include stress. Past treatments include ACE inhibitors and diuretics. The current treatment provides significant improvement. Compliance problems include exercise.  There is no history of angina or heart failure.   She has had more stress in her life recently but has felt more fatigued. She would like vitamins checked for a full work up. She states she sleeps fairly well at night and does not have problems getting to sleep. Denies heavy periods. She does not eat a lot of meat or foods with iron. She does not take MVI.  Migraines well controlled with Imitrex as needed. On average migraine once every 2 months.   Review of Systems  Constitutional: Negative for malaise/fatigue.  HENT: Negative for neck pain.   Eyes: Negative for blurred vision.  Respiratory: Negative for shortness of breath.   Cardiovascular: Negative for chest pain, palpitations, orthopnea and PND.  Neurological: Negative for headaches.       Objective:   Physical Exam  Constitutional: She is oriented to person, place, and time. She appears well-developed and well-nourished.  HENT:  Head: Normocephalic and atraumatic.  Neck: Normal range of motion. Neck supple.  Cardiovascular: Normal rate, regular rhythm and normal heart sounds.   Pulmonary/Chest: Effort normal and breath sounds normal.  Neurological: She is alert and oriented to person, place, and time.  Skin: Skin is warm and dry.  Psychiatric: She has a normal  mood and affect. Her behavior is normal.          Assessment & Plan:  HTN- Refilled meds. Controlled. Reminded of low salt diet and weight loss.  Migraine- Refilled imitrex.  Screening labs and work up for fatigue were given for patient to get drawn and follow up with CPE.

## 2013-06-25 ENCOUNTER — Encounter: Payer: Self-pay | Admitting: Emergency Medicine

## 2013-06-25 ENCOUNTER — Emergency Department (INDEPENDENT_AMBULATORY_CARE_PROVIDER_SITE_OTHER)
Admission: EM | Admit: 2013-06-25 | Discharge: 2013-06-25 | Disposition: A | Discharge status: Home / Self Care | Payer: PRIVATE HEALTH INSURANCE | Source: Home / Self Care | Attending: Emergency Medicine | Admitting: Emergency Medicine

## 2013-06-25 DIAGNOSIS — J209 Acute bronchitis, unspecified: Secondary | ICD-10-CM

## 2013-06-25 DIAGNOSIS — J0100 Acute maxillary sinusitis, unspecified: Secondary | ICD-10-CM

## 2013-06-25 MED ORDER — PROMETHAZINE-CODEINE 6.25-10 MG/5ML PO SYRP: ORAL_SOLUTION | ORAL | Status: DC

## 2013-06-25 MED ORDER — AZITHROMYCIN 250 MG PO TABS: ORAL_TABLET | ORAL | Status: DC

## 2013-06-25 NOTE — ED Notes (Signed)
Pt c/o nasal congestion, facial pain, sneezing, green nasal d/c, and cough x 3 days. Denies fever.

## 2013-06-25 NOTE — ED Provider Notes (Signed)
CSN: [id number]     Arrival date & time 06/25/13  1723 History   First MD Initiated Contact with Patient 06/25/13 1726     Chief Complaint  Patient presents with  . Nasal Congestion   (Consider location/radiation/quality/duration/timing/severity/associated sxs/prior Treatment) HPI SINUSITIS  Onset: 3-4 days Facial/sinus pressure with discolored nasal mucus.    Severity: moderate Tried OTC meds without significant relief.  Symptoms:  + Fever  + URI prodrome with nasal congestion + Minimal swollen neck glands + mild Sinus Headache + mild ear pressure  No Allergy symptoms No significant Sore Throat No eye symptoms     + Hacking, nonproductive Cough No chest pain No shortness of breath  No wheezing  No Abdominal Pain No Nausea No Vomiting No diarrhea  No Myalgias No focal neurologic symptoms No syncope No Rash  No Urinary symptoms          Past Medical History  Diagnosis Date  . Migraines   . Hypertension   . Anxiety    Past Surgical History  Procedure Laterality Date  . Cholecystectomy  2011   Family History  Problem Relation Age of Onset  . Hypertension Mother   . Hypertension Father   . Diabetes Father   . Cancer Father    History  Substance Use Topics  . Smoking status: Never Smoker   . Smokeless tobacco: Not on file  . Alcohol Use: Yes     Comment: occasional   OB History   Grav Para Term Preterm Abortions TAB SAB Ect Mult Living                 Review of Systems  All other systems reviewed and are negative.    Allergies  Review of patient's allergies indicates no known allergies.  Home Medications   Current Outpatient Rx  Name  Route  Sig  Dispense  Refill  . ALPRAZolam (XANAX) 0.25 MG tablet   Oral   Take 0.25 mg by mouth at bedtime as needed.           Marland Kitchen azithromycin (ZITHROMAX Z-PAK) 250 MG tablet      Take 2 tablets on day one, then 1 tablet daily on days 2 through 5   1 each   0   . citalopram (CELEXA) 20 MG  tablet   Oral   Take 20 mg by mouth daily.         . enalapril-hydrochlorothiazide (VASERETIC) 10-25 MG per tablet   Oral   Take 1 tablet by mouth daily.   30 tablet   11   . promethazine-codeine (PHENERGAN WITH CODEINE) 6.25-10 MG/5ML syrup      Take 1-2 teaspoons every 4-6 hours as needed for cough. May cause drowsiness.   120 mL   0   . SUMAtriptan (IMITREX) 100 MG tablet   Oral   Take 1 tablet (100 mg total) by mouth every 2 (two) hours as needed for migraine.   10 tablet   3    BP 129/75  Pulse 71  Temp(Src) 98 F (36.7 C) (Oral)  Resp 18  Ht 5\' 6"  (1.676 m)  Wt 194 lb (87.998 kg)  BMI 31.33 kg/m2  SpO2 98% Physical Exam  Nursing note and vitals reviewed. Constitutional: She is oriented to person, place, and time. She appears well-developed and well-nourished. No distress.  HENT:  Head: Normocephalic and atraumatic.  Right Ear: Tympanic membrane, external ear and ear canal normal.  Left Ear: Tympanic membrane, external ear and ear  canal normal.  Nose: Mucosal edema and rhinorrhea present. Right sinus exhibits maxillary sinus tenderness. Left sinus exhibits maxillary sinus tenderness.  Mouth/Throat: Oropharynx is clear and moist. No oral lesions. No oropharyngeal exudate.  Eyes: Right eye exhibits no discharge. Left eye exhibits no discharge. No scleral icterus.  Neck: Neck supple.  Cardiovascular: Normal rate, regular rhythm and normal heart sounds.   Pulmonary/Chest: Effort normal. She has no wheezes. She has rhonchi. She has no rales.  Lymphadenopathy:    She has no cervical adenopathy.  Neurological: She is alert and oriented to person, place, and time.  Skin: Skin is warm and dry.  Psychiatric: She has a normal mood and affect.    ED Course  Procedures (including critical care time) Labs Review Labs Reviewed - No data to display Imaging Review No results found.   MDM   1. Acute maxillary sinusitis   2. Acute bronchitis    Treatment options  discussed, as well as risks, benefits, alternatives. Patient voiced understanding and agreement with the following plans: Z-Pak. She states this is always worked well without side effects and she declined other antibiotic choices. Mucinex and other symptomatic care Promethazine with codeine cough syrup for sparing use as needed for severe cough at night Follow-up with your primary care doctor in 5-7 days if not improving, or sooner if symptoms become worse. Precautions discussed. Red flags discussed. Questions invited and answered. Patient voiced understanding and agreement.     Lajean Manesavid Massey, MD 06/25/13 2032

## 2013-07-28 ENCOUNTER — Other Ambulatory Visit: Payer: Self-pay | Admitting: Physician Assistant

## 2014-01-03 ENCOUNTER — Other Ambulatory Visit: Payer: Self-pay | Admitting: Obstetrics and Gynecology

## 2014-01-04 LAB — CYTOLOGY - PAP

## 2014-03-24 ENCOUNTER — Emergency Department (INDEPENDENT_AMBULATORY_CARE_PROVIDER_SITE_OTHER)
Admission: EM | Admit: 2014-03-24 | Discharge: 2014-03-24 | Disposition: A | Discharge status: Home / Self Care | Payer: PRIVATE HEALTH INSURANCE | Source: Home / Self Care | Attending: Family Medicine | Admitting: Family Medicine

## 2014-03-24 ENCOUNTER — Encounter: Payer: Self-pay | Admitting: *Deleted

## 2014-03-24 DIAGNOSIS — J040 Acute laryngitis: Secondary | ICD-10-CM

## 2014-03-24 MED ORDER — IPRATROPIUM BROMIDE 0.06 % NA SOLN: 2.0000 | Freq: Four times a day (QID) | NASAL | Status: DC

## 2014-03-24 MED ORDER — PREDNISONE 10 MG PO TABS: 30.0000 mg | ORAL_TABLET | Freq: Every day | ORAL | Status: DC

## 2014-03-24 NOTE — Discharge Instructions (Signed)
Thank you for coming in today. Take Medications as directed Call or go to the emergency room if you get worse, have trouble breathing, have chest pains, or palpitations.    Laryngitis At the top of your windpipe is your voice box. It is the source of your voice. Inside your voice box are 2 bands of muscles called vocal cords. When you breathe, your vocal cords are relaxed and open so that air can get into the lungs. When you decide to say something, these cords come together and vibrate. The sound from these vibrations goes into your throat and comes out through your mouth as sound. Laryngitis is an inflammation of the vocal cords that causes hoarseness, cough, loss of voice, sore throat, and dry throat. Laryngitis can be temporary (acute) or long-term (chronic). Most cases of acute laryngitis improve with time.Chronic laryngitis lasts for more than 3 weeks. CAUSES Laryngitis can often be related to excessive smoking, talking, or yelling, as well as inhalation of toxic fumes and allergies. Acute laryngitis is usually caused by a viral infection, vocal strain, measles or mumps, or bacterial infections. Chronic laryngitis is usually caused by vocal cord strain, vocal cord injury, postnasal drip, growths on the vocal cords, or acid reflux. SYMPTOMS   Cough.  Sore throat.  Dry throat. RISK FACTORS  Respiratory infections.  Exposure to irritating substances, such as cigarette smoke, excessive amounts of alcohol, stomach acids, and workplace chemicals.  Voice trauma, such as vocal cord injury from shouting or speaking too loud. DIAGNOSIS  Your cargiver will perform a physical exam. During the physical exam, your caregiver will examine your throat. The most common sign of laryngitis is hoarseness. Laryngoscopy may be necessary to confirm the diagnosis of this condition. This procedure allows your caregiver to look into the larynx. HOME CARE INSTRUCTIONS  Drink enough fluids to keep your urine  clear or pale yellow.  Rest until you no longer have symptoms or as directed by your caregiver.  Breathe in moist air.  Take all medicine as directed by your caregiver.  Do not smoke.  Talk as little as possible (this includes whispering).  Write on paper instead of talking until your voice is back to normal.  Follow up with your caregiver if your condition has not improved after 10 days. SEEK MEDICAL CARE IF:   You have trouble breathing.  You cough up blood.  You have persistent fever.  You have increasing pain.  You have difficulty swallowing. MAKE SURE YOU:  Understand these instructions.  Will watch your condition.  Will get help right away if you are not doing well or get worse. Document Released: 04/12/2005 Document Revised: 07/05/2011 Document Reviewed: 06/18/2010 [facility] Patient Information 2015 [place], [place]. This information is not intended to replace advice given to you by your health care provider. Make sure you discuss any questions you have with your health care provider.

## 2014-03-24 NOTE — ED Provider Notes (Signed)
Darlene RussellMelanie R Melendez is a 43 y.o. female who presents to Urgent Care today for cough congestion horse voice and headache. Symptoms present for 2 weeks. No fevers or chills vomiting or diarrhea. Patient has tried Alka-Seltzer which helps some.   Past Medical History  Diagnosis Date  . Migraines   . Hypertension   . Anxiety    Past Surgical History  Procedure Laterality Date  . Cholecystectomy  2011   History  Substance Use Topics  . Smoking status: Never Smoker   . Smokeless tobacco: Not on file  . Alcohol Use: Yes     Comment: occasional   ROS as above Medications: No current facility-administered medications for this encounter.   Current Outpatient Prescriptions  Medication Sig Dispense Refill  . ALPRAZolam (XANAX) 0.25 MG tablet Take 0.25 mg by mouth at bedtime as needed.      . citalopram (CELEXA) 20 MG tablet Take 20 mg by mouth daily.    . enalapril-hydrochlorothiazide (VASERETIC) 10-25 MG per tablet Take one tablet by mouth one time daily 30 tablet 10  . ipratropium (ATROVENT) 0.06 % nasal spray Place 2 sprays into both nostrils 4 (four) times daily. 15 mL 1  . predniSONE (DELTASONE) 10 MG tablet Take 3 tablets (30 mg total) by mouth daily. 15 tablet 0  . promethazine-codeine (PHENERGAN WITH CODEINE) 6.25-10 MG/5ML syrup Take 1-2 teaspoons every 4-6 hours as needed for cough. May cause drowsiness. 120 mL 0  . SUMAtriptan (IMITREX) 100 MG tablet Take 1 tablet (100 mg total) by mouth every 2 (two) hours as needed for migraine. 10 tablet 3   No Known Allergies   Exam:  BP 127/81 mmHg  Pulse 83  Temp(Src) 98.1 F (36.7 C) (Oral)  Ht 5\' 6"  (1.676 m)  Wt 191 lb (86.637 kg)  BMI 30.84 kg/m2  SpO2 98% Gen: Well NAD HEENT: EOMI,  MMM posterior pharynx with cobblestoning. Normal tympanic membranes bilaterally. Lungs: Normal work of breathing. CTABL hoarse voice Heart: RRR no MRG Abd: NABS, Soft. Nondistended, Nontender Exts: Brisk capillary refill, warm and well perfused.    No results found for this or any previous visit (from the past 24 hour(s)). No results found.  Assessment and Plan: 43 y.o. female with laryngitis. Treatment with prednisone and Atrovent nasal spray.  Discussed warning signs or symptoms. Please see discharge instructions. Patient expresses understanding.     Darlene BongEvan S Isha Seefeld, MD 03/24/14 [phone]

## 2014-03-24 NOTE — ED Notes (Signed)
Pt complains of cough, congestion, hoarse voice, headache 2/10.. SX started 2 weeks ago.

## 2014-06-20 ENCOUNTER — Other Ambulatory Visit: Payer: Self-pay | Admitting: Family Medicine

## 2014-08-13 ENCOUNTER — Ambulatory Visit (INDEPENDENT_AMBULATORY_CARE_PROVIDER_SITE_OTHER): Payer: PRIVATE HEALTH INSURANCE | Admitting: Physician Assistant

## 2014-08-13 ENCOUNTER — Encounter: Payer: Self-pay | Admitting: Physician Assistant

## 2014-08-13 ENCOUNTER — Other Ambulatory Visit: Payer: Self-pay | Admitting: Family Medicine

## 2014-08-13 VITALS — BP 150/84 | HR 80 | Wt 190.0 lb

## 2014-08-13 DIAGNOSIS — Z131 Encounter for screening for diabetes mellitus: Secondary | ICD-10-CM

## 2014-08-13 DIAGNOSIS — E785 Hyperlipidemia, unspecified: Secondary | ICD-10-CM | POA: Diagnosis not present

## 2014-08-13 DIAGNOSIS — I10 Essential (primary) hypertension: Secondary | ICD-10-CM

## 2014-08-13 DIAGNOSIS — Z79899 Other long term (current) drug therapy: Secondary | ICD-10-CM | POA: Diagnosis not present

## 2014-08-13 DIAGNOSIS — E669 Obesity, unspecified: Secondary | ICD-10-CM

## 2014-08-13 DIAGNOSIS — G43009 Migraine without aura, not intractable, without status migrainosus: Secondary | ICD-10-CM

## 2014-08-13 MED ORDER — ELETRIPTAN HYDROBROMIDE 20 MG PO TABS: 20.0000 mg | ORAL_TABLET | ORAL | Status: DC | PRN

## 2014-08-13 MED ORDER — ENALAPRIL-HYDROCHLOROTHIAZIDE 10-25 MG PO TABS: 1.0000 | ORAL_TABLET | Freq: Every day | ORAL | Status: DC

## 2014-08-13 NOTE — Patient Instructions (Signed)
Belviq Saxenda Contrave Qsymia

## 2014-08-14 ENCOUNTER — Other Ambulatory Visit: Payer: Self-pay | Admitting: Family Medicine

## 2014-08-15 LAB — COMPLETE METABOLIC PANEL WITH GFR
ALK PHOS: 62 U/L (ref 39–117)
ALT: 23 U/L (ref 0–35)
AST: 19 U/L (ref 0–37)
Albumin: 4.2 g/dL (ref 3.5–5.2)
BILIRUBIN TOTAL: 0.5 mg/dL (ref 0.2–1.2)
BUN: 10 mg/dL (ref 6–23)
CO2: 27 mEq/L (ref 19–32)
Calcium: 8.7 mg/dL (ref 8.4–10.5)
Chloride: 104 mEq/L (ref 96–112)
Creat: 0.77 mg/dL (ref 0.50–1.10)
GFR, Est African American: >89 mL/min
GLUCOSE: 78 mg/dL (ref 70–99)
Potassium: 3.8 mEq/L (ref 3.5–5.3)
Sodium: 140 mEq/L (ref 135–145)
Total Protein: 6.7 g/dL (ref 6.0–8.3)

## 2014-08-15 LAB — LIPID PANEL
Cholesterol: 230 mg/dL — ABNORMAL HIGH (ref 0–200)
HDL: 67 mg/dL (ref >=46)
LDL Cholesterol: 137 mg/dL — ABNORMAL HIGH (ref 0–99)
TRIGLYCERIDES: 132 mg/dL (ref <150)
Total CHOL/HDL Ratio: 3.4 Ratio
VLDL: 26 mg/dL (ref 0–40)

## 2014-08-16 DIAGNOSIS — E785 Hyperlipidemia, unspecified: Secondary | ICD-10-CM | POA: Insufficient documentation

## 2014-08-16 NOTE — Progress Notes (Signed)
   Subjective:    Patient ID: Darlene RussellMelanie R Melendez, female    DOB: 1971-04-23, 44 y.o.   MRN: [id number]  HPI Pt presents to the clinic for medication refills.   HTN- pt has been out of BP medications for a few days. No cp, palpitations, headaches or vision changes. Checks BP and never elevated at work or home.   Migraines once or twice a month and imitrex used but makes nauseated but does get rid of migraine. Wonders if anything else.    Review of Systems  All other systems reviewed and are negative.      Objective:   Physical Exam  Constitutional: She is oriented to person, place, and time. She appears well-developed and well-nourished.  obese  HENT:  Head: Normocephalic and atraumatic.  Cardiovascular: Normal rate, regular rhythm and normal heart sounds.   Pulmonary/Chest: Effort normal and breath sounds normal.  Neurological: She is alert and oriented to person, place, and time.  Skin: Skin is dry.  Psychiatric: She has a normal mood and affect. Her behavior is normal.          Assessment & Plan:  HTN- not had medication. BP always been controlled with medication.  Refilled medication. Discussed hx of being controlled. Pt checks at home. Ordered cmp.   Migraines- stop imitrex. Try relpax. Given coupon. Follow up if not tolerating.   Hx of elevated lipid. Reorder labs.   Needs CPE.   Obesity- discussed weight loss diet and exercise plan. Mentioned medications to aid with weight loss. Come back in to discuss and get started.

## 2014-08-17 ENCOUNTER — Other Ambulatory Visit: Payer: Self-pay | Admitting: Physician Assistant

## 2014-08-19 ENCOUNTER — Telehealth: Payer: Self-pay | Admitting: *Deleted

## 2014-08-19 ENCOUNTER — Other Ambulatory Visit: Payer: Self-pay | Admitting: Physician Assistant

## 2014-08-19 MED ORDER — NALTREXONE-BUPROPION HCL ER 8-90 MG PO TB12: ORAL_TABLET | ORAL | Status: DC

## 2014-08-19 NOTE — Telephone Encounter (Signed)
Pt left vm stating that she has decided on the Contrave and would like for you to send it to [facility].

## 2014-09-19 ENCOUNTER — Other Ambulatory Visit: Payer: Self-pay | Admitting: Physician Assistant

## 2014-12-14 ENCOUNTER — Other Ambulatory Visit: Payer: Self-pay | Admitting: Physician Assistant

## 2014-12-16 NOTE — Telephone Encounter (Signed)
Pt scheduled for appt.

## 2015-01-01 ENCOUNTER — Ambulatory Visit (INDEPENDENT_AMBULATORY_CARE_PROVIDER_SITE_OTHER): Payer: PRIVATE HEALTH INSURANCE | Admitting: Physician Assistant

## 2015-01-01 ENCOUNTER — Encounter: Payer: Self-pay | Admitting: Physician Assistant

## 2015-01-01 VITALS — BP 121/81 | HR 96 | Ht 66.0 in | Wt 180.0 lb

## 2015-01-01 DIAGNOSIS — Z0000 Encounter for general adult medical examination without abnormal findings: Secondary | ICD-10-CM

## 2015-01-01 DIAGNOSIS — I10 Essential (primary) hypertension: Secondary | ICD-10-CM

## 2015-01-01 DIAGNOSIS — E785 Hyperlipidemia, unspecified: Secondary | ICD-10-CM | POA: Diagnosis not present

## 2015-01-01 DIAGNOSIS — L988 Other specified disorders of the skin and subcutaneous tissue: Secondary | ICD-10-CM

## 2015-01-01 DIAGNOSIS — Z23 Encounter for immunization: Secondary | ICD-10-CM | POA: Diagnosis not present

## 2015-01-01 MED ORDER — ENALAPRIL-HYDROCHLOROTHIAZIDE 10-25 MG PO TABS: 1.0000 | ORAL_TABLET | Freq: Every day | ORAL | Status: DC

## 2015-01-01 NOTE — Progress Notes (Signed)
Subjective:     Darlene Melendez is a 44 y.o. female and is here for a comprehensive physical exam. The patient reports she does have a place on her chest that she wants looked at. noticed for several months. most of time flat but sometimes oozes clear substance. no bleeding. not growing or getting larger. not done anything to make better. nothing seems to make worse. .  Social History   Social History  . Marital Status: Married    Spouse Name: N/A  . Number of Children: N/A  . Years of Education: N/A   Occupational History  . Not on file.   Social History Main Topics  . Smoking status: Never Smoker   . Smokeless tobacco: Not on file  . Alcohol Use: Yes     Comment: occasional  . Drug Use: No  . 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 Rana Snare.    Other Topics Concern  . Not on file   Social History Narrative   Health Maintenance  Topic Date Due  . HIV Screening  07/19/1983  . MAMMOGRAM  08/10/2012  . TETANUS/TDAP  04/26/2013  . INFLUENZA VACCINE  01/01/2016 (Originally 11/25/2014)  . PAP SMEAR  01/03/2017    The following portions of the patient's history were reviewed and updated as appropriate: allergies, current medications, past family history, past medical history, past social history, past surgical history and problem list.  Review of Systems A comprehensive review of systems was negative.   Objective:    BP 121/81 mmHg  Pulse 96  Ht 5\' 6"  (1.676 m)  Wt 180 lb (81.647 kg)  BMI 29.07 kg/m2 General appearance: alert, cooperative, appears stated age and mildly obese Head: Normocephalic, without obvious abnormality, atraumatic Eyes: conjunctivae/corneas clear. PERRL, EOM's intact. Fundi benign. Ears: normal TM's and external ear canals both ears Nose: Nares normal. Septum midline. Mucosa normal. No drainage or sinus  tenderness. Throat: lips, mucosa, and tongue normal; teeth and gums normal Neck: no adenopathy, no carotid bruit, no JVD, supple, symmetrical, trachea midline and thyroid not enlarged, symmetric, no tenderness/mass/nodules Back: symmetric, no curvature. ROM normal. No CVA tenderness. Lungs: clear to auscultation bilaterally Heart: regular rate and rhythm, S1, S2 normal, no murmur, click, rub or gallop Abdomen: soft, non-tender; bowel sounds normal; no masses,  no organomegaly Extremities: extremities normal, atraumatic, no cyanosis or edema Pulses: 2+ and symmetric Skin: Skin color, texture, turgor normal. No rashes or lesions or 3mm by 2mm macule with some hyperpigmentation and shiny.  Lymph nodes: Cervical, supraclavicular, and axillary nodes normal. Neurologic: Grossly normal    Assessment:    Healthy female exam.      Plan:    CPE- labs done 4 months ago. Last pap 12/2013 has GYN. Mammogram is due and coming up. Discussed healthy diet. Encouraged calcium 1500mg  and vitamin d 800units.  Tdap given today.  Pt declined flu shot will get at work.   Hyperlipidemia-LDL was checked 4 months ago and was 137. It is too soon to check this time. Discussed diet changes as well as working on weight loss. Patient has artery lost 10 pounds from starting an exercise plan. Keep up the good work.   Skin macule- discussed appears to be a sebaceous hyperplasia or even a forming seborrheic keratosis. Encouraged patient that this was benign in nature. Keep a watch on it for any  color changes or growth.   Hypertension-refilled medication for 6 months. Doing great. See After Visit Summary for Counseling Recommendations

## 2015-01-01 NOTE — Addendum Note (Signed)
Addended by: Donne Anon L on: 01/01/2015 03:54 PM   Modules accepted: Orders

## 2015-01-01 NOTE — Patient Instructions (Signed)

## 2015-08-24 ENCOUNTER — Emergency Department (INDEPENDENT_AMBULATORY_CARE_PROVIDER_SITE_OTHER)
Admission: EM | Admit: 2015-08-24 | Discharge: 2015-08-24 | Disposition: A | Discharge status: Home / Self Care | Payer: PRIVATE HEALTH INSURANCE | Source: Home / Self Care | Attending: Family Medicine | Admitting: Family Medicine

## 2015-08-24 ENCOUNTER — Encounter: Payer: Self-pay | Admitting: Emergency Medicine

## 2015-08-24 DIAGNOSIS — R69 Illness, unspecified: Principal | ICD-10-CM

## 2015-08-24 DIAGNOSIS — J111 Influenza due to unidentified influenza virus with other respiratory manifestations: Secondary | ICD-10-CM | POA: Diagnosis not present

## 2015-08-24 LAB — POCT INFLUENZA A/B
Influenza A, POC: NEGATIVE
Influenza B, POC: NEGATIVE

## 2015-08-24 MED ORDER — ACETAMINOPHEN 500 MG PO TABS
1000.0000 mg | ORAL_TABLET | Freq: Once | ORAL | Status: AC
  Administered 2015-08-24: 1000 mg via ORAL

## 2015-08-24 MED ORDER — OSELTAMIVIR PHOSPHATE 75 MG PO CAPS: 75.0000 mg | ORAL_CAPSULE | Freq: Two times a day (BID) | ORAL | Status: DC

## 2015-08-24 NOTE — ED Notes (Signed)
Reports onset of fever, chills, general aches and headache 2 days ago. Took ibuprofen 600mg  3 hours ago for temp of 103. Denies nausea, vomiting, diarrhea, abdominal pain and sore throat.

## 2015-08-24 NOTE — ED Provider Notes (Signed)
CSN: [id number]     Arrival date & time 08/24/15  1559 History   First MD Initiated Contact with Patient 08/24/15 1643     Chief Complaint  Patient presents with  . Fever  . Generalized Body Aches  . Headache      HPI Comments: Patient developed rather rapid onset of flu-like symptoms about 48 hours ago including fever, chills, sweats, myalgias, fatigue, sinus congestion, and headache.  No cough or sore throat.  No GI or GU symptoms.  The history is provided by the patient.    Past Medical History  Diagnosis Date  . Migraines   . Hypertension   . Anxiety    Past Surgical History  Procedure Laterality Date  . Cholecystectomy  2011   Family History  Problem Relation Age of Onset  . Hypertension Mother   . Hypertension Father   . Diabetes Father   . Cancer Father    Social History  Substance Use Topics  . Smoking status: Never Smoker   . Smokeless tobacco: None  . Alcohol Use: Yes     Comment: occasional   OB History    No data available     Review of Systems No sore throat No cough No pleuritic pain No wheezing + nasal congestion ? post-nasal drainage No sinus pain/pressure No itchy/red eyes No earache No hemoptysis No SOB + fever, + chills No nausea No vomiting No abdominal pain No diarrhea No urinary symptoms No skin rash + fatigue + myalgias + headache Used OTC meds without relief   Allergies  Imitrex  Home Medications   Prior to Admission medications   Medication Sig Start Date End Date Taking? Authorizing Provider  citalopram (CELEXA) 20 MG tablet Take 20 mg by mouth daily.    Historical Provider, MD  eletriptan (RELPAX) 20 MG tablet Take 1 tablet (20 mg total) by mouth as needed for migraine or headache. May repeat in 2 hours if headache persists or recurs. 08/13/14   Jade L Breeback, PA-C  enalapril-hydrochlorothiazide (VASERETIC) 10-25 MG per tablet Take 1 tablet by mouth daily. 01/01/15   Jade L Breeback, PA-C  oseltamivir (TAMIFLU) 75 MG  capsule Take 1 capsule (75 mg total) by mouth every 12 (twelve) hours. 08/24/15   Lattie HawStephen A Erica Richwine, MD   Meds Ordered and Administered this Visit   Medications  acetaminophen (TYLENOL) tablet 1,000 mg (1,000 mg Oral Given 08/24/15 1626)    BP 107/68 mmHg  Pulse 113  Temp(Src) 101.9 F (38.8 C) (Oral)  Resp 16  Ht 5\' 6"  (1.676 m)  Wt 184 lb (83.462 kg)  BMI 29.71 kg/m2  SpO2 95% No data found.   Physical Exam Nursing notes and Vital Signs reviewed. Appearance:  Patient appears stated age, and in no acute distress Eyes:  Pupils are equal, round, and reactive to light and accomodation.  Extraocular movement is intact.  Conjunctivae are not inflamed  Ears:  Canals normal.  Tympanic membranes normal.  Nose:  Mildly congested turbinates.  No sinus tenderness.   Pharynx:  Normal Neck:  Supple.  Tender enlarged posterior/lateral nodes are palpated bilaterally  Lungs:  Clear to auscultation.  Breath sounds are equal.  Moving air well. Heart:  Regular rate and rhythm without murmurs, rubs, or gallops.  Abdomen:  Nontender without masses or hepatosplenomegaly.  Bowel sounds are present.  No CVA or flank tenderness.  Extremities:  No edema.  Skin:  No rash present.   ED Course  Procedures none    Labs  Reviewed  POCT INFLUENZA A/B negative     MDM   1. Influenza-like illness; ?false negative flu test   Begin Tamiflu. If cold like symptoms develop, try the following: Take plain guaifenesin (  extended release tabs such as Mucinex) twice daily, with plenty of water, for cough and congestion.  Get adequate rest.   May use Afrin nasal spray (or generic oxymetazoline) twice daily for about 5 days and then discontinue.  Also recommend using saline nasal spray several times daily and saline nasal irrigation (AYR is a common brand).  Use Flonase nasal spray each morning after using Afrin nasal spray and saline nasal irrigation. Try warm salt water gargles for sore throat.  Stop all  antihistamines for now, and other non-prescription cough/cold preparations. May take Ibuprofen , 4 tabs every 8 hours with food for fever, body aches, etc. May take Delsym Cough Suppressant at bedtime for nighttime cough.  Follow-up with family doctor if not improving about 3 to 4 days.    Lattie Haw, MD 08/27/15 [phone]

## 2015-08-24 NOTE — Discharge Instructions (Signed)
If cold like symptoms develop, try the following: Take plain guaifenesin (1200mg  extended release tabs such as Mucinex) twice daily, with plenty of water, for cough and congestion.  Get adequate rest.   May use Afrin nasal spray (or generic oxymetazoline) twice daily for about 5 days and then discontinue.  Also recommend using saline nasal spray several times daily and saline nasal irrigation (AYR is a common brand).  Use Flonase nasal spray each morning after using Afrin nasal spray and saline nasal irrigation. Try warm salt water gargles for sore throat.  Stop all antihistamines for now, and other non-prescription cough/cold preparations. May take Ibuprofen 200mg , 4 tabs every 8 hours with food for fever, body aches, etc. May take Delsym Cough Suppressant at bedtime for nighttime cough.  Follow-up with family doctor if not improving about 3 to 4 days.

## 2015-08-25 ENCOUNTER — Telehealth: Payer: Self-pay | Admitting: Emergency Medicine

## 2015-08-26 ENCOUNTER — Encounter: Payer: Self-pay | Admitting: *Deleted

## 2015-08-26 ENCOUNTER — Emergency Department (INDEPENDENT_AMBULATORY_CARE_PROVIDER_SITE_OTHER)
Admission: EM | Admit: 2015-08-26 | Discharge: 2015-08-26 | Disposition: A | Discharge status: Home / Self Care | Payer: PRIVATE HEALTH INSURANCE | Source: Home / Self Care | Attending: Family Medicine | Admitting: Family Medicine

## 2015-08-26 ENCOUNTER — Emergency Department (INDEPENDENT_AMBULATORY_CARE_PROVIDER_SITE_OTHER): Payer: PRIVATE HEALTH INSURANCE

## 2015-08-26 DIAGNOSIS — J189 Pneumonia, unspecified organism: Secondary | ICD-10-CM

## 2015-08-26 DIAGNOSIS — J181 Lobar pneumonia, unspecified organism: Principal | ICD-10-CM

## 2015-08-26 LAB — POCT CBC W AUTO DIFF (K'VILLE URGENT CARE)

## 2015-08-26 MED ORDER — DOXYCYCLINE HYCLATE 100 MG PO CAPS: 100.0000 mg | ORAL_CAPSULE | Freq: Two times a day (BID) | ORAL | Status: DC

## 2015-08-26 MED ORDER — GUAIFENESIN-CODEINE 100-10 MG/5ML PO SOLN: ORAL | Status: DC

## 2015-08-26 MED ORDER — ACETAMINOPHEN 325 MG PO TABS
650.0000 mg | ORAL_TABLET | Freq: Once | ORAL | Status: AC
  Administered 2015-08-26: 650 mg via ORAL

## 2015-08-26 NOTE — Discharge Instructions (Signed)
Take plain guaifenesin (  extended release tabs such as Mucinex) twice daily, with plenty of water, for cough and congestion.   Get adequate rest.   May take Extra Strength Tylenol as needed for headache. Stop all antihistamines for now, and other non-prescription cough/cold preparations. If symptoms become significantly worse during the night or over the weekend, proceed to the local emergency room.    Community-Acquired Pneumonia, Adult Pneumonia is an infection of the lungs. There are different types of pneumonia. One type can develop while a person is in a hospital. A different type, called community-acquired pneumonia, develops in people who are not, or have not recently been, in the hospital or other health care facility.  CAUSES Pneumonia may be caused by bacteria, viruses, or funguses. Community-acquired pneumonia is often caused by Streptococcus pneumonia bacteria. These bacteria are often passed from one person to another by breathing in droplets from the cough or sneeze of an infected person. RISK FACTORS The condition is more likely to develop in:  People who havechronic diseases, such as chronic obstructive pulmonary disease (COPD), asthma, congestive heart failure, cystic fibrosis, diabetes, or kidney disease.  People who haveearly-stage or late-stage HIV.  People who havesickle cell disease.  People who havehad their spleen removed (splenectomy).  People who havepoor Administrator.  People who havemedical conditions that increase the risk of breathing in (aspirating) secretions their own mouth and nose.   People who havea weakened immune system (immunocompromised).  People who smoke.  People whotravel to areas where pneumonia-causing germs commonly exist.  People whoare around animal habitats or animals that have pneumonia-causing germs, including birds, bats, rabbits, cats, and farm animals. SYMPTOMS Symptoms of this condition include:  Adry  cough.  A wet (productive) cough.  Fever.  Sweating.  Chest pain, especially when breathing deeply or coughing.  Rapid breathing or difficulty breathing.  Shortness of breath.  Shaking chills.  Fatigue.  Muscle aches. DIAGNOSIS Your health care provider will take a medical history and perform a physical exam. You may also have other tests, including:  Imaging studies of your chest, including X-rays.  Tests to check your blood oxygen level and other blood gases.  Other tests on blood, mucus (sputum), fluid around your lungs (pleural fluid), and urine. If your pneumonia is severe, other tests may be done to identify the specific cause of your illness. TREATMENT The type of treatment that you receive depends on many factors, such as the cause of your pneumonia, the medicines you take, and other medical conditions that you have. For most adults, treatment and recovery from pneumonia may occur at home. In some cases, treatment must happen in a hospital. Treatment may include:  Antibiotic medicines, if the pneumonia was caused by bacteria.  Antiviral medicines, if the pneumonia was caused by a virus.  Medicines that are given by mouth or through an IV tube.  Oxygen.  Respiratory therapy. Although rare, treating severe pneumonia may include:  Mechanical ventilation. This is done if you are not breathing well on your own and you cannot maintain a safe blood oxygen level.  Thoracentesis. This procedureremoves fluid around one lung or both lungs to help you breathe better. HOME CARE INSTRUCTIONS  Take over-the-counter and prescription medicines only as told by your health care provider.  Only takecough medicine if you are losing sleep. Understand that cough medicine can prevent your body's natural ability to remove mucus from your lungs.  If you were prescribed an antibiotic medicine, take it as told by your health  care provider. Do not stop taking the antibiotic even if  you start to feel better.  Sleep in a semi-upright position at night. Try sleeping in a reclining chair, or place a few pillows under your head.  Do not use tobacco products, including cigarettes, chewing tobacco, and e-cigarettes. If you need help quitting, ask your health care provider.  Drink enough water to keep your urine clear or pale yellow. This will help to thin out mucus secretions in your lungs. PREVENTION There are ways that you can decrease your risk of developing community-acquired pneumonia. Consider getting a pneumococcal vaccine if:  You are older than 47 years of age.  You are older than 47 years of age and are undergoing cancer treatment, have chronic lung disease, or have other medical conditions that affect your immune system. Ask your health care provider if this applies to you. There are different types and schedules of pneumococcal vaccines. Ask your health care provider which vaccination option is best for you. You may also prevent community-acquired pneumonia if you take these actions:  Get an influenza vaccine every year. Ask your health care provider which type of influenza vaccine is best for you.  Go to the dentist on a regular basis.  Wash your hands often. Use hand sanitizer if soap and water are not available. SEEK MEDICAL CARE IF:  You have a fever.  You are losing sleep because you cannot control your cough with cough medicine. SEEK IMMEDIATE MEDICAL CARE IF:  You have worsening shortness of breath.  You have increased chest pain.  Your sickness becomes worse, especially if you are an older adult or have a weakened immune system.  You cough up blood.   This information is not intended to replace advice given to you by your health care provider. Make sure you discuss any questions you have with your health care provider.   Document Released: 04/12/2005 Document Revised: 01/01/2015 Document Reviewed: 08/07/2014 Elsevier Interactive Patient  Education [employer]2016 Elsevier Inc.

## 2015-08-26 NOTE — ED Notes (Addendum)
Pt c/o fever, chills, aches x 3 days. Seen 2 days ago, given Rx for tamiflu which she has not fill due to costs. Cough started monday and intermittent sharp pain on the right side of her head. Took 800mg  IBF @ 1130am. Also taking Robitussin and Nyquil. Current temp 103.1. 650mg  tylenol given per protocol.

## 2015-08-26 NOTE — ED Provider Notes (Signed)
CSN: [id number]     Arrival date & time 08/26/15  1235 History   First MD Initiated Contact with Patient 08/26/15 1323     Chief Complaint  Patient presents with  . Fever  . Cough      HPI Comments: Patient treated for flu-like illness two days ago.  She complains of increasing fever.  She developed non-productive cough yesterday, with persistent sinus congestion and now a mild sore throat.    The history is provided by the patient.    Past Medical History  Diagnosis Date  . Migraines   . Hypertension   . Anxiety    Past Surgical History  Procedure Laterality Date  . Cholecystectomy  2011   Family History  Problem Relation Age of Onset  . Hypertension Mother   . Hypertension Father   . Diabetes Father   . Cancer Father    Social History  Substance Use Topics  . Smoking status: Never Smoker   . Smokeless tobacco: None  . Alcohol Use: Yes     Comment: occasional   OB History    No data available     Review of Systems + sore throat + cough No pleuritic pain No wheezing + nasal congestion + post-nasal drainage No sinus pain/pressure No itchy/red eyes No earache No hemoptysis No SOB + fever, + chills No nausea No vomiting No abdominal pain No diarrhea No urinary symptoms No skin rash + fatigue + myalgias + headache Used OTC meds without relief  Allergies  Imitrex  Home Medications   Prior to Admission medications   Medication Sig Start Date End Date Taking? Authorizing Provider  citalopram (CELEXA) 20 MG tablet Take 20 mg by mouth daily.    Historical Provider, MD  doxycycline (VIBRAMYCIN) 100 MG capsule Take 1 capsule (100 mg total) by mouth 2 (two) times daily. Take with food. 08/26/15   Lattie Haw, MD  eletriptan (RELPAX) 20 MG tablet Take 1 tablet (20 mg total) by mouth as needed for migraine or headache. May repeat in 2 hours if headache persists or recurs. 08/13/14   Jade L Breeback, PA-C  enalapril-hydrochlorothiazide (VASERETIC) 10-25 MG  per tablet Take 1 tablet by mouth daily. 01/01/15   Jomarie Longs, PA-C  guaiFENesin-codeine 100-10 MG/5ML syrup Take 10mL by mouth at bedtime as needed for cough 08/26/15   Lattie Haw, MD   Meds Ordered and Administered this Visit   Medications  acetaminophen (TYLENOL) tablet 650 mg (650 mg Oral Given 08/26/15 1258)    BP 107/70 mmHg  Pulse 115  Temp(Src) 103.1 F (39.5 C) (Oral)  Resp 16  Wt 187 lb (84.823 kg)  SpO2 92%  LMP 07/29/2015 No data found.   Physical Exam Nursing notes and Vital Signs reviewed. Appearance:  Patient appears stated age, and in no acute distress Eyes:  Pupils are equal, round, and reactive to light and accomodation.  Extraocular movement is intact.  Conjunctivae are not inflamed  Ears:  Canals normal.  Tympanic membranes normal.  Nose:  Mildly congested turbinates.  No sinus tenderness.    Pharynx:  Normal Neck:  Supple.  Tender enlarged posterior/lateral nodes are palpated bilaterally  Lungs:  Clear to auscultation.  Breath sounds are equal.  Moving air well. Heart:  Regular rate and rhythm without murmurs, rubs, or gallops.  Abdomen:  Nontender without masses or hepatosplenomegaly.  Bowel sounds are present.  No CVA or flank tenderness.  Extremities:  No edema.  Skin:  No rash present.  ED Course  Procedures none    Labs Reviewed  POCT CBC W AUTO DIFF ([facility]):  WBC 9.0; LY 11.0; MO 7.0; GR 82.0; Hgb 12.4; Platelets 191     Imaging Review Dg Chest 2 View  08/26/2015  CLINICAL DATA:  Cough, fever and body aches since Sunday. EXAM: CHEST  2 VIEW COMPARISON:  None. FINDINGS: The heart size and mediastinal contours are within normal limits. There is left upper lobe pneumonia. The right lung is clear. There is no pleural effusion. The visualized skeletal structures are unremarkable. IMPRESSION: Left upper lobe pneumonia. Electronically Signed   By: Sherian ReinWei-Chen  Lin M.D.   On: 08/26/2015 14:00      MDM   1. Left upper lobe pneumonia;  ?mycoplasma (note normal WBC 9.0)   Begin doxycycline 100mg  BID for atypical coverage (other appropriate antibiotics at risk for QT prolongation with her present meds). Rx for Robitussin AC for night time cough.  Take plain guaifenesin (1200mg  extended release tabs such as Mucinex) twice daily, with plenty of water, for cough and congestion.   Get adequate rest.   May take Extra Strength Tylenol as needed for headache. Stop all antihistamines for now, and other non-prescription cough/cold preparations. If symptoms become significantly worse during the night or over the weekend, proceed to the local emergency room.  Recommend follow-up with PCP in two weeks for repeat chest X-ray.    Lattie HawStephen A Beese, MD 08/26/15 1447

## 2015-08-27 ENCOUNTER — Encounter (HOSPITAL_BASED_OUTPATIENT_CLINIC_OR_DEPARTMENT_OTHER): Payer: Self-pay

## 2015-08-27 ENCOUNTER — Emergency Department (HOSPITAL_BASED_OUTPATIENT_CLINIC_OR_DEPARTMENT_OTHER): Payer: PRIVATE HEALTH INSURANCE

## 2015-08-27 ENCOUNTER — Emergency Department (HOSPITAL_BASED_OUTPATIENT_CLINIC_OR_DEPARTMENT_OTHER)
Admission: EM | Admit: 2015-08-27 | Discharge: 2015-08-27 | Disposition: A | Discharge status: Home / Self Care | Payer: PRIVATE HEALTH INSURANCE | Attending: Emergency Medicine | Admitting: Emergency Medicine

## 2015-08-27 DIAGNOSIS — N179 Acute kidney failure, unspecified: Secondary | ICD-10-CM | POA: Diagnosis not present

## 2015-08-27 DIAGNOSIS — E876 Hypokalemia: Secondary | ICD-10-CM | POA: Diagnosis not present

## 2015-08-27 DIAGNOSIS — Z79899 Other long term (current) drug therapy: Secondary | ICD-10-CM | POA: Diagnosis not present

## 2015-08-27 DIAGNOSIS — R51 Headache: Secondary | ICD-10-CM | POA: Diagnosis present

## 2015-08-27 DIAGNOSIS — I10 Essential (primary) hypertension: Secondary | ICD-10-CM | POA: Insufficient documentation

## 2015-08-27 DIAGNOSIS — J189 Pneumonia, unspecified organism: Secondary | ICD-10-CM

## 2015-08-27 HISTORY — DX: Pneumonia, unspecified organism: J18.9

## 2015-08-27 LAB — COMPREHENSIVE METABOLIC PANEL
ALK PHOS: 102 U/L (ref 38–126)
ALT: 22 U/L (ref 14–54)
ANION GAP: 10 (ref 5–15)
AST: 22 U/L (ref 15–41)
Albumin: 3.1 g/dL — ABNORMAL LOW (ref 3.5–5.0)
BUN: 16 mg/dL (ref 6–20)
CALCIUM: 8.5 mg/dL — AB (ref 8.9–10.3)
CHLORIDE: 96 mmol/L — AB (ref 101–111)
CO2: 26 mmol/L (ref 22–32)
CREATININE: 1.31 mg/dL — AB (ref 0.44–1.00)
GFR, EST AFRICAN AMERICAN: 55 mL/min — AB (ref >60)
GFR, EST NON AFRICAN AMERICAN: 48 mL/min — AB (ref >60)
GLUCOSE: 127 mg/dL — AB (ref 65–99)
Potassium: 2.7 mmol/L — CL (ref 3.5–5.1)
Sodium: 132 mmol/L — ABNORMAL LOW (ref 135–145)
Total Bilirubin: 0.5 mg/dL (ref 0.3–1.2)
Total Protein: 7 g/dL (ref 6.5–8.1)

## 2015-08-27 LAB — CBC WITH DIFFERENTIAL/PLATELET
Band Neutrophils: 13 %
Basophils Absolute: 0 10*3/uL (ref 0.0–0.1)
Basophils Relative: 0 %
EOS PCT: 0 %
Eosinophils Absolute: 0 10*3/uL (ref 0.0–0.7)
HEMATOCRIT: 33.8 % — AB (ref 36.0–46.0)
Hemoglobin: 12 g/dL (ref 12.0–15.0)
LYMPHS ABS: 0.1 10*3/uL — AB (ref 0.7–4.0)
Lymphocytes Relative: 2 %
MCH: 32.8 pg (ref 26.0–34.0)
MCHC: 35.5 g/dL (ref 30.0–36.0)
MCV: 92.3 fL (ref 78.0–100.0)
MONOS PCT: 1 %
Monocytes Absolute: 0.1 10*3/uL (ref 0.1–1.0)
NEUTROS ABS: 7.1 10*3/uL (ref 1.7–7.7)
Neutrophils Relative %: 84 %
Platelets: 171 10*3/uL (ref 150–400)
RBC: 3.66 MIL/uL — ABNORMAL LOW (ref 3.87–5.11)
RDW: 12.5 % (ref 11.5–15.5)
WBC: 7.3 10*3/uL (ref 4.0–10.5)

## 2015-08-27 LAB — PREGNANCY, URINE: Preg Test, Ur: NEGATIVE

## 2015-08-27 LAB — SEDIMENTATION RATE: SED RATE: 85 mm/h — AB (ref 0–22)

## 2015-08-27 MED ORDER — IOPAMIDOL (ISOVUE-370) INJECTION 76%
100.0000 mL | Freq: Once | INTRAVENOUS | Status: AC | PRN
  Administered 2015-08-27: 100 mL via INTRAVENOUS

## 2015-08-27 MED ORDER — PROCHLORPERAZINE EDISYLATE 5 MG/ML IJ SOLN
10.0000 mg | Freq: Once | INTRAMUSCULAR | Status: AC
  Administered 2015-08-27: 10 mg via INTRAVENOUS
  Filled 2015-08-27: qty 2

## 2015-08-27 MED ORDER — METHYLPREDNISOLONE SODIUM SUCC 125 MG IJ SOLR
125.0000 mg | Freq: Once | INTRAMUSCULAR | Status: AC
  Administered 2015-08-27: 125 mg via INTRAVENOUS
  Filled 2015-08-27: qty 2

## 2015-08-27 MED ORDER — DIPHENHYDRAMINE HCL 50 MG/ML IJ SOLN
25.0000 mg | Freq: Once | INTRAMUSCULAR | Status: AC
Start: 2015-08-27 — End: 2015-08-27
  Administered 2015-08-27: 25 mg via INTRAVENOUS
  Filled 2015-08-27: qty 1

## 2015-08-27 MED ORDER — AMOXICILLIN-POT CLAVULANATE 875-125 MG PO TABS
1.0000 | ORAL_TABLET | Freq: Once | ORAL | Status: AC
  Administered 2015-08-27: 1 via ORAL
  Filled 2015-08-27: qty 1

## 2015-08-27 MED ORDER — KETOROLAC TROMETHAMINE 30 MG/ML IJ SOLN
30.0000 mg | Freq: Once | INTRAMUSCULAR | Status: AC
  Administered 2015-08-27: 30 mg via INTRAVENOUS
  Filled 2015-08-27: qty 1

## 2015-08-27 MED ORDER — POTASSIUM CHLORIDE CRYS ER 20 MEQ PO TBCR: 40.0000 meq | EXTENDED_RELEASE_TABLET | Freq: Two times a day (BID) | ORAL | Status: DC

## 2015-08-27 MED ORDER — MAGNESIUM SULFATE 2 GM/50ML IV SOLN
2.0000 g | Freq: Once | INTRAVENOUS | Status: AC
  Administered 2015-08-27: 2 g via INTRAVENOUS
  Filled 2015-08-27: qty 50

## 2015-08-27 MED ORDER — POTASSIUM CHLORIDE 10 MEQ/100ML IV SOLN
10.0000 meq | INTRAVENOUS | Status: AC
  Administered 2015-08-27: 10 meq via INTRAVENOUS
  Filled 2015-08-27: qty 100

## 2015-08-27 MED ORDER — AMOXICILLIN-POT CLAVULANATE 875-125 MG PO TABS: 1.0000 | ORAL_TABLET | Freq: Two times a day (BID) | ORAL | Status: DC

## 2015-08-27 MED ORDER — SODIUM CHLORIDE 0.9 % IV BOLUS (SEPSIS)
2000.0000 mL | Freq: Once | INTRAVENOUS | Status: AC
  Administered 2015-08-27: 2000 mL via INTRAVENOUS

## 2015-08-27 MED ORDER — ACETAMINOPHEN 500 MG PO TABS
1000.0000 mg | ORAL_TABLET | Freq: Once | ORAL | Status: AC
  Administered 2015-08-27: 1000 mg via ORAL
  Filled 2015-08-27: qty 2

## 2015-08-27 MED ORDER — POTASSIUM CHLORIDE CRYS ER 20 MEQ PO TBCR
40.0000 meq | EXTENDED_RELEASE_TABLET | Freq: Once | ORAL | Status: AC
  Administered 2015-08-27: 40 meq via ORAL
  Filled 2015-08-27: qty 2

## 2015-08-27 MED FILL — POTASSIUM CL ER 20 MEQ TABL: 20 | 7 days supply | Qty: 28 | Fill #0

## 2015-08-27 MED FILL — AMOX-CLAV 875-125 MG TABLET: 875-125 | 7 days supply | Qty: 14 | Fill #0

## 2015-08-27 NOTE — ED Provider Notes (Signed)
Patient feels better. Per plan with Dr. Clayborne DanaMesner, will d/c home.  Pricilla LovelessScott Katyra Tomassetti, MD 08/27/15 1626

## 2015-08-27 NOTE — ED Provider Notes (Signed)
CSN: [id number]     Arrival date & time 08/27/15  1142 History   First MD Initiated Contact with Patient 08/27/15 1158     Chief Complaint  Patient presents with  . Headache     (Consider location/radiation/quality/duration/timing/severity/associated sxs/prior Treatment) Patient is a 45 y.o. female presenting with headaches.  Headache Pain location:  R temporal Quality:  Sharp Radiates to:  Does not radiate Severity currently:  9/10 Onset quality:  Gradual Duration:  3 days Timing:  Constant Chronicity:  New Similar to prior headaches: yes   Context: bright light   Ineffective treatments:  Acetaminophen and NSAIDs Associated symptoms: cough   Associated symptoms: no abdominal pain, no back pain, no eye pain and no fever     Past Medical History  Diagnosis Date  . Migraines   . Hypertension   . Anxiety   . Pneumonia    Past Surgical History  Procedure Laterality Date  . Cholecystectomy  2011   Family History  Problem Relation Age of Onset  . Hypertension Mother   . Hypertension Father   . Diabetes Father   . Cancer Father    Social History  Substance Use Topics  . Smoking status: Never Smoker   . Smokeless tobacco: None  . Alcohol Use: Yes     Comment: occasional   OB History    No data available     Review of Systems  Constitutional: Negative for fever and chills.  Eyes: Negative for pain.  Respiratory: Positive for cough and shortness of breath.   Cardiovascular: Negative for chest pain.  Gastrointestinal: Negative for abdominal pain.  Musculoskeletal: Negative for back pain.  Neurological: Positive for headaches (right temple, shooting).  All other systems reviewed and are negative.     Allergies  Imitrex  Home Medications   Prior to Admission medications   Medication Sig Start Date End Date Taking? Authorizing Provider  citalopram (CELEXA) 20 MG tablet Take 20 mg by mouth daily.    Historical Provider, MD  doxycycline (VIBRAMYCIN) 100 MG  capsule Take 1 capsule (100 mg total) by mouth 2 (two) times daily. Take with food. 08/26/15   Kandra Nicolas, MD  eletriptan (RELPAX) 20 MG tablet Take 1 tablet (20 mg total) by mouth as needed for migraine or headache. May repeat in 2 hours if headache persists or recurs. 08/13/14   Jade L Breeback, PA-C  enalapril-hydrochlorothiazide (VASERETIC) 10-25 MG per tablet Take 1 tablet by mouth daily. 01/01/15   Jade L Breeback, PA-C  guaiFENesin-codeine 100-10 MG/5ML syrup Take 100m by mouth at bedtime as needed for cough 08/26/15   SKandra Nicolas MD   BP 103/64 mmHg  Pulse 93  Temp(Src) 102.8 F (39.3 C) (Oral)  Resp 18  Ht 5' 6"  (1.676 m)  Wt 187 lb (84.823 kg)  BMI 30.20 kg/m2  SpO2 89%  LMP 07/29/2015 Physical Exam  Constitutional: She appears well-developed and well-nourished.  HENT:  Head: Normocephalic and atraumatic.  No temporal tenderness on either side  Neck: Normal range of motion.  Cardiovascular: Normal rate and regular rhythm.   Pulmonary/Chest: No stridor. No respiratory distress. Rales: left lung diffuse.  Abdominal: She exhibits no distension.  Neurological: She is alert.  No altered mental status, able to give full seemingly accurate history.  Face is symmetric, EOM's intact, pupils equal and reactive, vision intact, tongue and uvula midline without deviation Upper and Lower extremity motor 5/5, intact pain perception in distal extremities, 2+ reflexes in biceps, patella and achilles  tendons. Finger to nose normal, heel to shin normal.   Skin: Skin is warm and dry.  Nursing note and vitals reviewed.   ED Course  Procedures (including critical care time) Labs Review Labs Reviewed - No data to display  Imaging Review Dg Chest 2 View  08/26/2015  CLINICAL DATA:  Cough, fever and body aches since Sunday. EXAM: CHEST  2 VIEW COMPARISON:  None. FINDINGS: The heart size and mediastinal contours are within normal limits. There is left upper lobe pneumonia. The right lung is  clear. There is no pleural effusion. The visualized skeletal structures are unremarkable. IMPRESSION: Left upper lobe pneumonia. Electronically Signed   By: Abelardo Diesel M.D.   On: 08/26/2015 14:00   I have personally reviewed and evaluated these images and lab results as part of my medical decision-making.   EKG Interpretation None      MDM   Final diagnoses:  CAP (community acquired pneumonia)  Hypokalemia  AKI (acute kidney injury) ([place])   Considered meningitis but with 4 days of symptoms and PNA on XR, unlikely bacterial. Discussed LP with patient and she refuses. She thinks her headache will improve with symptomatic care.  XR not impressive with her O2 of 92%, will eval for PE. Will also check ct maxillofacial for sinusitis. Will give fluids, check labs (to include ESR in the case of temporal arteritis) and reevaluate.   HA improving. Not hypoxic, rr ok. Still doubt meningitis. CT w/ e/o multifocal pneumonia, will add on augmentin. Rest of labs ok aside from K so will supplement that and give some magnesium as well. The combo of which (with compazine) should also help headache.   Care transferred pending electrolyte replacement and discharge when complete as long no worsening symptoms. Will follow up with pcp in one week for recheck of labs.     Merrily Pew, MD 08/28/15 2117

## 2015-08-27 NOTE — ED Notes (Signed)
C.o HA since Monday-dx with PNE yesterday at urgent care-presents to triage in w/c-keeps eyes closed in triage-husband with pt

## 2015-08-27 NOTE — ED Notes (Signed)
Patient transported to CT 

## 2015-09-01 ENCOUNTER — Telehealth: Payer: Self-pay | Admitting: *Deleted

## 2015-09-01 ENCOUNTER — Ambulatory Visit (INDEPENDENT_AMBULATORY_CARE_PROVIDER_SITE_OTHER): Payer: PRIVATE HEALTH INSURANCE

## 2015-09-01 ENCOUNTER — Encounter: Payer: Self-pay | Admitting: Physician Assistant

## 2015-09-01 ENCOUNTER — Ambulatory Visit (INDEPENDENT_AMBULATORY_CARE_PROVIDER_SITE_OTHER): Payer: PRIVATE HEALTH INSURANCE | Admitting: Physician Assistant

## 2015-09-01 VITALS — BP 137/67 | HR 103 | Ht 66.0 in | Wt 189.0 lb

## 2015-09-01 DIAGNOSIS — E876 Hypokalemia: Secondary | ICD-10-CM

## 2015-09-01 DIAGNOSIS — R51 Headache: Secondary | ICD-10-CM

## 2015-09-01 DIAGNOSIS — R519 Headache, unspecified: Secondary | ICD-10-CM | POA: Insufficient documentation

## 2015-09-01 DIAGNOSIS — J189 Pneumonia, unspecified organism: Secondary | ICD-10-CM

## 2015-09-01 MED ORDER — ALBUTEROL SULFATE HFA 108 (90 BASE) MCG/ACT IN AERS: 2.0000 | INHALATION_SPRAY | Freq: Four times a day (QID) | RESPIRATORY_TRACT | Status: DC | PRN

## 2015-09-01 MED ORDER — CYCLOBENZAPRINE HCL 10 MG PO TABS: 10.0000 mg | ORAL_TABLET | Freq: Three times a day (TID) | ORAL | Status: DC | PRN

## 2015-09-01 MED ORDER — IBUPROFEN 800 MG PO TABS: 800.0000 mg | ORAL_TABLET | Freq: Three times a day (TID) | ORAL | Status: DC | PRN

## 2015-09-01 NOTE — Telephone Encounter (Signed)
Future cxr ordered to check resolution of pne.

## 2015-09-01 NOTE — Progress Notes (Addendum)
Subjective:    Patient ID: Darlene Melendez, female    DOB: November 29, 1970, 45 y.o.   MRN: [id number]  HPI Patient is here today for a follow up after being seen at the [facility] ED on 08/26/13 for a severe, throbbing headache. She was diagnosed with left upper lobe pneumonia at the urgent care on 08/25/13 and given Doxycycline. At the hospital, she had a head CT and a chest CT which were negative. She was told she was dehydrated and was given a headache cocktail. They sent her home with Augmentin. Her potassium was low and she was sent home with potassium replacement.   She states she is feeling some better since being seen in the ED but is still having neck pain, headache, and shortness of breath with exertion. She is taking both the doxycycline and augmentin as prescribed. She has taken Ibuprofen and Tylenol at home with some relief. She does have a history of Migraines and was prescribed Relpax in the past, but did not get it filled because it was so expensive. She also has chronic neck pain that she treats symptomatically with ice. Patient states neck pain and headache are localized to the left side. She has good range of motion in her neck, but states "it feels like it needs to pop but wont."   Her temp has been coming down since last Wednesday, but she has been waking up in night sweats. She still has a productive cough. She denies shortness of breath and wheezing at rest.     Review of Systems  All other systems reviewed and are negative.      Objective:   Physical Exam  Constitutional: She is oriented to person, place, and time. She appears well-developed and well-nourished.  HENT:  Head: Normocephalic and atraumatic.  Eyes: Conjunctivae are normal. Right eye exhibits no discharge. Left eye exhibits no discharge.  Neck: Normal range of motion. No muscular tenderness present. No rigidity. No edema, no erythema and normal range of motion present.  Cardiovascular: Normal rate, regular  rhythm and normal heart sounds.   Pulmonary/Chest: Effort normal.  Decreased breath sounds in the Left Upper Lobe. All other lung fields with normal breath sounds. No wheezes, rhonci, rales.   Musculoskeletal: Normal range of motion. She exhibits no tenderness.  Lymphadenopathy:    She has no cervical adenopathy.  Neurological: She is alert and oriented to person, place, and time.  Psychiatric: She has a normal mood and affect. Her behavior is normal.          Assessment & Plan:  1. Left Upper Lobe Pneumonia- Patient diagnosed with LUF pneumonia 08/27/15. Pulse ox today 93%. Duoneb given in office with pulse ox improvement to 95%. Advised patient to continue Doxycyline and Augmentin as prescribed. I am sending Albuterol for her to use every 4-6 hrs for the next 3 days, then PRN. Ordered repeat chest Xray today to determine if pneumonia is improving or has worsened. If she does not feel better by Thursday, RTC.   2. Migraines- Patient presents with a history of migraines which are triggered by chronic neck pain. She does not have a headache today, so Toradol not given in the office. Advised patient to use Ibuprofen 800 mg PRN for headache relief. I am sending Flexeril to help relieve neck pain.   3. Hypokalemia- Lab work at hospital on 08/27/15 revealed hypokalemia with a K of 2.7. Patient was given potassium chloride tablets which she has been taking for the past  5 days. I am going to recheck a CMP today to evaluate potassium levels.

## 2015-09-02 ENCOUNTER — Encounter: Payer: Self-pay | Admitting: Physician Assistant

## 2015-09-02 DIAGNOSIS — R748 Abnormal levels of other serum enzymes: Secondary | ICD-10-CM | POA: Insufficient documentation

## 2015-09-02 LAB — CBC WITH DIFFERENTIAL/PLATELET
BASOS PCT: 1 %
Basophils Absolute: 82 cells/uL (ref 0–200)
EOS ABS: 164 {cells}/uL (ref 15–500)
EOS PCT: 2 %
HCT: 38.1 % (ref 35.0–45.0)
Hemoglobin: 13 g/dL (ref 11.7–15.5)
LYMPHS ABS: 2378 {cells}/uL (ref 850–3900)
LYMPHS PCT: 29 %
MCH: 32.3 pg (ref 27.0–33.0)
MCHC: 34.1 g/dL (ref 32.0–36.0)
MCV: 94.8 fL (ref 80.0–100.0)
MONOS PCT: 5 %
MPV: 9 fL (ref 7.5–12.5)
Monocytes Absolute: 410 cells/uL (ref 200–950)
NEUTROS ABS: 5166 {cells}/uL (ref 1500–7800)
Neutrophils Relative %: 63 %
PLATELETS: 395 10*3/uL (ref 140–400)
RBC: 4.02 MIL/uL (ref 3.80–5.10)
RDW: 13.3 % (ref 11.0–15.0)
WBC: 8.2 10*3/uL (ref 3.8–10.8)

## 2015-09-02 LAB — COMPLETE METABOLIC PANEL WITH GFR
ALT: 73 U/L — ABNORMAL HIGH (ref 6–29)
AST: 68 U/L — AB (ref 10–35)
Albumin: 3.8 g/dL (ref 3.6–5.1)
Alkaline Phosphatase: 90 U/L (ref 33–115)
BUN: 18 mg/dL (ref 7–25)
CHLORIDE: 97 mmol/L — AB (ref 98–110)
CO2: 29 mmol/L (ref 20–31)
Calcium: 9.2 mg/dL (ref 8.6–10.2)
Creat: 0.94 mg/dL (ref 0.50–1.10)
GFR, EST NON AFRICAN AMERICAN: 72 mL/min (ref >=60)
GFR, Est African American: 84 mL/min (ref >=60)
GLUCOSE: 93 mg/dL (ref 65–99)
POTASSIUM: 4.7 mmol/L (ref 3.5–5.3)
SODIUM: 138 mmol/L (ref 135–146)
Total Bilirubin: 0.3 mg/dL (ref 0.2–1.2)
Total Protein: 6.8 g/dL (ref 6.1–8.1)

## 2015-09-12 ENCOUNTER — Telehealth: Payer: Self-pay | Admitting: *Deleted

## 2015-09-12 ENCOUNTER — Other Ambulatory Visit: Payer: Self-pay | Admitting: *Deleted

## 2015-09-12 DIAGNOSIS — J189 Pneumonia, unspecified organism: Secondary | ICD-10-CM

## 2015-09-12 DIAGNOSIS — E876 Hypokalemia: Secondary | ICD-10-CM

## 2015-09-12 NOTE — Telephone Encounter (Signed)
CMP ordered to recheck potassium levels. 

## 2015-09-17 ENCOUNTER — Ambulatory Visit (INDEPENDENT_AMBULATORY_CARE_PROVIDER_SITE_OTHER): Payer: PRIVATE HEALTH INSURANCE

## 2015-09-17 DIAGNOSIS — J189 Pneumonia, unspecified organism: Secondary | ICD-10-CM

## 2015-09-18 LAB — COMPLETE METABOLIC PANEL WITH GFR
ALBUMIN: 4 g/dL (ref 3.6–5.1)
ALT: 15 U/L (ref 6–29)
AST: 17 U/L (ref 10–35)
Alkaline Phosphatase: 69 U/L (ref 33–115)
BUN: 13 mg/dL (ref 7–25)
CHLORIDE: 100 mmol/L (ref 98–110)
CO2: 29 mmol/L (ref 20–31)
CREATININE: 0.77 mg/dL (ref 0.50–1.10)
Calcium: 9.5 mg/dL (ref 8.6–10.2)
GFR, Est African American: >89 mL/min (ref >=60)
GFR, Est Non African American: >89 mL/min (ref >=60)
GLUCOSE: 97 mg/dL (ref 65–99)
POTASSIUM: 3.7 mmol/L (ref 3.5–5.3)
Sodium: 140 mmol/L (ref 135–146)
TOTAL PROTEIN: 7 g/dL (ref 6.1–8.1)
Total Bilirubin: 0.5 mg/dL (ref 0.2–1.2)

## 2016-01-06 ENCOUNTER — Other Ambulatory Visit: Payer: Self-pay | Admitting: Physician Assistant

## 2016-02-06 ENCOUNTER — Other Ambulatory Visit: Payer: Self-pay | Admitting: Physician Assistant

## 2016-02-14 ENCOUNTER — Other Ambulatory Visit: Payer: Self-pay | Admitting: Physician Assistant

## 2016-03-18 ENCOUNTER — Other Ambulatory Visit: Payer: Self-pay | Admitting: Physician Assistant

## 2016-03-22 ENCOUNTER — Ambulatory Visit (INDEPENDENT_AMBULATORY_CARE_PROVIDER_SITE_OTHER): Payer: PRIVATE HEALTH INSURANCE

## 2016-03-22 ENCOUNTER — Encounter: Payer: Self-pay | Admitting: Physician Assistant

## 2016-03-22 ENCOUNTER — Ambulatory Visit (INDEPENDENT_AMBULATORY_CARE_PROVIDER_SITE_OTHER): Payer: PRIVATE HEALTH INSURANCE | Admitting: Physician Assistant

## 2016-03-22 VITALS — BP 140/57 | HR 86 | Ht 66.0 in | Wt 202.0 lb

## 2016-03-22 DIAGNOSIS — M238X9 Other internal derangements of unspecified knee: Secondary | ICD-10-CM | POA: Diagnosis not present

## 2016-03-22 DIAGNOSIS — M769 Unspecified enthesopathy, lower limb, excluding foot: Secondary | ICD-10-CM | POA: Diagnosis not present

## 2016-03-22 DIAGNOSIS — R635 Abnormal weight gain: Secondary | ICD-10-CM

## 2016-03-22 DIAGNOSIS — L989 Disorder of the skin and subcutaneous tissue, unspecified: Secondary | ICD-10-CM

## 2016-03-22 DIAGNOSIS — Z13220 Encounter for screening for lipoid disorders: Secondary | ICD-10-CM

## 2016-03-22 DIAGNOSIS — Z114 Encounter for screening for human immunodeficiency virus [HIV]: Secondary | ICD-10-CM

## 2016-03-22 DIAGNOSIS — I10 Essential (primary) hypertension: Secondary | ICD-10-CM

## 2016-03-22 DIAGNOSIS — Z131 Encounter for screening for diabetes mellitus: Secondary | ICD-10-CM

## 2016-03-22 DIAGNOSIS — M170 Bilateral primary osteoarthritis of knee: Secondary | ICD-10-CM | POA: Insufficient documentation

## 2016-03-22 MED ORDER — ENALAPRIL-HYDROCHLOROTHIAZIDE 10-25 MG PO TABS: 1.0000 | ORAL_TABLET | Freq: Every day | ORAL | 5 refills | Status: DC

## 2016-03-22 MED ORDER — LORCASERIN HCL 10 MG PO TABS: 1.0000 | ORAL_TABLET | Freq: Two times a day (BID) | ORAL | 1 refills | Status: DC

## 2016-03-22 MED ORDER — TRIAMCINOLONE ACETONIDE 0.1 % EX CREA: 1.0000 "application " | TOPICAL_CREAM | Freq: Two times a day (BID) | CUTANEOUS | 0 refills | Status: DC

## 2016-03-22 NOTE — Progress Notes (Addendum)
Subjective:    Patient ID: Darlene RussellMelanie R Melendez, female    DOB: [birth year], 45 y.o.   MRN: [id number]  HPI Patient is a 45 yo female coming to the clinic for a annual physical. Patient is requesting a refill on Vaseretic.Patient did not take Vaseretic today due to running out of the medication yesterday.  She also complains of crepitus in both of her knees. She also states that her knees hurt when she goes up and down the stairs. Patient denies ankle or hip pain. Patient denies weakness or tingling in lower extremities.   Patient is complaining of a lesion on her right hand that has been present for about a month. She states that it is scaly and itches occasionally.    Health Maintenance:  Patient had her last pap smear a year ago and is going back to her GYN in January 2018. Patient had a mammogram a year ago and is scheduled for another one in January 2018. A cyst was found in her right breast and is being followed through yearly mammograms.    Vitals:   03/22/16 0900  BP: (!) 140/57  Pulse: 86    Review of Systems  HENT: Negative for congestion, sinus pain and sore throat.   Respiratory: Negative for cough, chest tightness, shortness of breath and wheezing.   Cardiovascular: Negative for chest pain and palpitations.  Gastrointestinal: Negative for abdominal pain, constipation and diarrhea.  Genitourinary: Negative for dysuria, frequency and vaginal discharge.  Musculoskeletal:       See HPI   Skin:       See HPI  Neurological:       See HPI   Psychiatric/Behavioral: Negative for agitation, confusion and dysphoric mood.       Objective:   Physical Exam  Constitutional: She is oriented to person, place, and time. She appears well-developed and well-nourished.  HENT:  Head: Normocephalic and atraumatic.  External auditory canals without erythema bilaterally.Tympanic membranes clear and visual bilaterally.   Eyes: Pupils are equal, round, and reactive to light.  Neck:  Neck supple. No thyromegaly present.  Cardiovascular: Normal rate and regular rhythm.  Exam reveals no gallop and no friction rub.   No murmur heard. Pulmonary/Chest: Effort normal and breath sounds normal. No respiratory distress. She has no wheezes. She has no rales. She exhibits no tenderness.  Abdominal: Soft. Bowel sounds are normal. She exhibits no distension and no mass. There is no tenderness. There is no rebound and no guarding.  Musculoskeletal: She exhibits tenderness (to knee joint bilaterally. Patient has crepitus in both knees. ). She exhibits no edema or deformity.  Neurological: She is alert and oriented to person, place, and time.  Cranial nerves II-XII grossly intact.   Skin:  Small 2cm lesion on dorsum of right hand. It is erythematous and scaly.           Assessment & Plan:  Darlene Melendez was seen today for annual exam.  Diagnoses and all orders for this visit:  Knee crepitus, unspecified laterality -     DG Knee Bilateral Standing AP -     DG Knee Complete 4 Views Left; Future -     DG Knee Complete 4 Views Right; Future  Get knee X-rays bilaterally to get a baseline since patient is having crepitus and pain in both knees. Exercises for patellofemoral syndrome given. NSAIDs as needed. Encouraged weight loss.   Screening for lipid disorders -     Lipid panel  Check lipid panel since  the last time it was checked has been a year and the patient had abnormal values last time.   Screening for diabetes mellitus -     COMPLETE METABOLIC PANEL WITH GFR  Check CMP to check glucose, electrolytes, liver and kidney function.   Encounter for screening for HIV -     HIV antibody (with reflex)  Patient has never had HIV testing done. Patient agreed to have this done today for her records.   Essential hypertension, benign -     enalapril-hydrochlorothiazide (VASERETIC) 10-25 MG tablet; Take 1 tablet by mouth daily.  Continue taking Vaseretic daily for hypertension.    Skin lesion -     triamcinolone cream (KENALOG) 0.1 %; Apply 1 application topically 2 (two) times daily.  Patient was instructed to put Kenalog cream on the lesion on the dorsum of her right hand two times a day. If the lesion does not go away with Kenalog, then could be possible actinic keratosis. Have patient follow-up if lesion does not dissipate in the next month.   Weight gain -     Lorcaserin HCl 10 MG TABS; Take 1 tablet by mouth 2 (two) times daily.  Started patient on Lorcaserin two times a day to help aid with weight loss. Patient was also instructed to limit fatty foods and to eat mor fruits and vegetables. Patient also agreed to exercising three times a week.  Follow up in 2 months.

## 2016-04-07 ENCOUNTER — Other Ambulatory Visit: Payer: Self-pay | Admitting: Physician Assistant

## 2016-04-07 DIAGNOSIS — E781 Pure hyperglyceridemia: Secondary | ICD-10-CM | POA: Insufficient documentation

## 2016-06-09 ENCOUNTER — Encounter: Payer: Self-pay | Admitting: Emergency Medicine

## 2016-06-09 ENCOUNTER — Emergency Department (INDEPENDENT_AMBULATORY_CARE_PROVIDER_SITE_OTHER)
Admission: EM | Admit: 2016-06-09 | Discharge: 2016-06-09 | Disposition: A | Discharge status: Home / Self Care | Payer: PRIVATE HEALTH INSURANCE | Source: Home / Self Care | Attending: Family Medicine | Admitting: Family Medicine

## 2016-06-09 DIAGNOSIS — B9789 Other viral agents as the cause of diseases classified elsewhere: Secondary | ICD-10-CM | POA: Diagnosis not present

## 2016-06-09 DIAGNOSIS — J069 Acute upper respiratory infection, unspecified: Secondary | ICD-10-CM | POA: Diagnosis not present

## 2016-06-09 DIAGNOSIS — J040 Acute laryngitis: Secondary | ICD-10-CM

## 2016-06-09 MED ORDER — BENZONATATE 100 MG PO CAPS: 100.0000 mg | ORAL_CAPSULE | Freq: Three times a day (TID) | ORAL | 0 refills | Status: DC

## 2016-06-09 MED ORDER — AMOXICILLIN-POT CLAVULANATE 875-125 MG PO TABS: 1.0000 | ORAL_TABLET | Freq: Two times a day (BID) | ORAL | 0 refills | Status: DC

## 2016-06-09 NOTE — ED Provider Notes (Signed)
CSN: [id number]     Arrival date & time 06/09/16  1256 History   First MD Initiated Contact with Patient 06/09/16 1335     Chief Complaint  Patient presents with  . Nasal Congestion   (Consider location/radiation/quality/duration/timing/severity/associated sxs/prior Treatment) HPI Darlene Melendez is a 46 y.o. female presenting to UC with c/o 1 week of nasal congestion, dry cough that is mildly intermittent but worse at night, intermittent hoarse voice, and sinus pressure with mild pain.  Mild fatigue. 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 Denies known fever. Denies n/v/d. She has tried OTC cough medication with minimal relief.    Past Medical History:  Diagnosis Date  . Anxiety   . Hypertension   . Migraines   . Pneumonia    Past Surgical History:  Procedure Laterality Date  . CHOLECYSTECTOMY  2011   Family History  Problem Relation Age of Onset  . Hypertension Mother   . Hypertension Father   . Diabetes Father   . Cancer Father    Social History  Substance Use Topics  . Smoking status: Never Smoker  . Smokeless tobacco: Never Used  . Alcohol use Yes     Comment: occasional   OB History    No data available     Review of Systems  Constitutional: Negative for chills and fever.  HENT: Positive for congestion, postnasal drip, rhinorrhea, sinus pain, sinus pressure, sore throat and voice change. Negative for ear pain and trouble swallowing.   Respiratory: Positive for cough. Negative for shortness of breath.   Cardiovascular: Negative for chest pain and palpitations.  Gastrointestinal: Negative for abdominal pain, diarrhea, nausea and vomiting.  Musculoskeletal: Negative for arthralgias, back pain and myalgias.  Skin: Negative for rash.  Neurological: Positive for headaches. Negative for dizziness and light-headedness.    Allergies  Imitrex [sumatriptan]  Home Medications   Prior to Admission medications   Medication Sig Start  Date End Date Taking? Authorizing Provider  amoxicillin-clavulanate (AUGMENTIN) 875-125 MG tablet Take 1 tablet by mouth 2 (two) times daily. One po bid x 7 days 06/09/16   Junius FinnerErin O'Malley, PA-C  benzonatate (TESSALON) 100 MG capsule Take 1-2 capsules (100-200 mg total) by mouth every 8 (eight) hours. 06/09/16   Junius FinnerErin O'Malley, PA-C  citalopram (CELEXA) 20 MG tablet Take 20 mg by mouth daily.    Historical Provider, MD  enalapril-hydrochlorothiazide (VASERETIC) 10-25 MG tablet Take 1 tablet by mouth daily. 03/22/16   Jade L Breeback, PA-C  ibuprofen (ADVIL,MOTRIN) 800 MG tablet Take 1 tablet (800 mg total) by mouth every 8 (eight) hours as needed. 09/01/15   Jade L Breeback, PA-C  Lorcaserin HCl 10 MG TABS Take 1 tablet by mouth 2 (two) times daily. 03/22/16   Jade L Breeback, PA-C  triamcinolone cream (KENALOG) 0.1 % Apply 1 application topically 2 (two) times daily. 03/22/16   Jomarie LongsJade L Breeback, PA-C   Meds Ordered and Administered this Visit  Medications - No data to display  BP 137/75 (BP Location: Left Arm)   Pulse 91   Temp 98.3 F (36.8 C) (Oral)   Ht 5\' 6"  (1.676 m)   Wt 200 lb (90.7 kg)   LMP 05/14/2016   SpO2 97%   BMI 32.28 kg/m  No data found.   Physical Exam  Constitutional: She is oriented to person, place, and time. She appears well-developed and well-nourished. No distress.  HENT:  Head: Normocephalic and atraumatic.  Right Ear: Tympanic membrane normal.  Left Ear: Tympanic membrane normal.  Nose: Nose normal. Right sinus exhibits no maxillary sinus tenderness and no frontal sinus tenderness. Left sinus exhibits no maxillary sinus tenderness and no frontal sinus tenderness.  Mouth/Throat: Uvula is midline, oropharynx is clear and moist and mucous membranes are normal.  Eyes: EOM are normal.  Neck: Normal range of motion. Neck supple.  Hoarse voice but no stridor.  Cardiovascular: Normal rate and regular rhythm.   Pulmonary/Chest: Effort normal and breath sounds normal. No  stridor. No respiratory distress. She has no wheezes. She has no rales.  Musculoskeletal: Normal range of motion.  Lymphadenopathy:    She has no cervical adenopathy.  Neurological: She is alert and oriented to person, place, and time.  Skin: Skin is warm and dry. She is not diaphoretic.  Psychiatric: She has a normal mood and affect. Her behavior is normal.  Nursing note and vitals reviewed.   Urgent Care Course     Procedures (including critical care time)  Labs Review Labs Reviewed - No data to display  Imaging Review No results found.    MDM   1. Viral URI with cough   2. Laryngitis    Hx and exam c/w viral URI with laryngitis.  No evidence of underlying bacterial infection at this time.  Rx: tessalon Prescription to hold for Augmentin with expiration date.  Home care instructions provided. F/u with PCP in 1 week if not improving.    Junius Finner, PA-C 06/09/16 1359

## 2016-06-09 NOTE — ED Triage Notes (Signed)
Nasal congestion, dry cough, loosing voice, sinus pain and pressure, fatigue x 1 week

## 2016-06-09 NOTE — Discharge Instructions (Signed)
Your symptoms are likely due to a virus such as the common cold, however, if you developing worsening chest congestion with shortness of breath, persistent fever for 3 days, or symptoms not improving in 4-5 days, you may fill the antibiotic (Augmentin (amoxicillin-clavulanate).  If you do fill the antibiotic,  please take antibiotics as prescribed and be sure to complete entire course even if you start to feel better to ensure infection does not come back.

## 2016-08-25 ENCOUNTER — Emergency Department (INDEPENDENT_AMBULATORY_CARE_PROVIDER_SITE_OTHER)
Admission: EM | Admit: 2016-08-25 | Discharge: 2016-08-25 | Disposition: A | Discharge status: Home / Self Care | Payer: PRIVATE HEALTH INSURANCE | Source: Home / Self Care | Attending: Family Medicine | Admitting: Family Medicine

## 2016-08-25 ENCOUNTER — Encounter: Payer: Self-pay | Admitting: Emergency Medicine

## 2016-08-25 DIAGNOSIS — J069 Acute upper respiratory infection, unspecified: Secondary | ICD-10-CM

## 2016-08-25 DIAGNOSIS — J029 Acute pharyngitis, unspecified: Secondary | ICD-10-CM | POA: Diagnosis not present

## 2016-08-25 DIAGNOSIS — B9789 Other viral agents as the cause of diseases classified elsewhere: Secondary | ICD-10-CM

## 2016-08-25 LAB — POCT RAPID STREP A (OFFICE): Rapid Strep A Screen: NEGATIVE

## 2016-08-25 MED ORDER — PREDNISONE 20 MG PO TABS: ORAL_TABLET | ORAL | 0 refills | Status: DC

## 2016-08-25 MED ORDER — DOXYCYCLINE HYCLATE 100 MG PO CAPS: 100.0000 mg | ORAL_CAPSULE | Freq: Two times a day (BID) | ORAL | 0 refills | Status: DC

## 2016-08-25 NOTE — Discharge Instructions (Signed)
Take plain guaifenesin (  extended release tabs such as Mucinex) twice daily, with plenty of water, for cough and congestion.  May continue Pseudoephedrine ( , one or two every 4 to 6 hours) for sinus congestion.  Get adequate rest.   May use Afrin nasal spray (or generic oxymetazoline) each morning for about 5 days and then discontinue.  Also recommend using saline nasal spray several times daily and saline nasal irrigation (AYR is a common brand).  Use Flonase nasal spray each morning after using Afrin nasal spray and saline nasal irrigation. Try warm salt water gargles for sore throat.  Stop all antihistamines for now, and other non-prescription cough/cold preparations. May take Tessalon at bedtime for cough if needed. Begin doxycycline if not improving about one week or if persistent fever develops   Follow-up with family doctor if not improving about10 days.

## 2016-08-25 NOTE — ED Triage Notes (Signed)
Sore throat, nasal drainage, headache, hoarseness, cough, malaise x 3 days

## 2016-08-25 NOTE — ED Provider Notes (Signed)
[facility]    CSN: [id number] Arrival date & time: 08/25/16  1233     History   Chief Complaint Chief Complaint  Patient presents with  . Sore Throat    HPI Darlene Melendez is a 46 y.o. female.   Patient complains of three day history of typical cold-like symptoms developing over several days, including mild sore throat, sinus congestion, headache, fatigue, and cough.  She has seasonal rhinitis that flares in the spring and fall, and has been controlling her symptoms with Sudafed and Flonase.  She notes that her colds tend to linger, and she has a past history of pneumonia.   The history is provided by the patient.    Past Medical History:  Diagnosis Date  . Anxiety   . Hypertension   . Migraines   . Pneumonia     Patient Active Problem List   Diagnosis Date Noted  . Hypertriglyceridemia 04/07/2016  . Skin lesion 03/22/2016  . Knee crepitus 03/22/2016  . Primary osteoarthritis of knees, bilateral 03/22/2016  . Elevated liver enzymes 09/02/2015  . CAP (community acquired pneumonia) 09/01/2015  . Hypokalemia 09/01/2015  . Cephalalgia 09/01/2015  . Skin macule 01/01/2015  . Hyperlipidemia 08/16/2014  . Obesity 08/13/2014  . GALLSTONES 08/13/2009  . ROTATOR CUFF SYNDROME, RIGHT 05/27/2008  . NECK STIFFNESS 06/09/2007  . Migraine without aura 03/31/2007  . ESSENTIAL HYPERTENSION, BENIGN 03/31/2007  . Weight gain 03/31/2007    Past Surgical History:  Procedure Laterality Date  . CHOLECYSTECTOMY  2011    OB History    No data available       Home Medications    Prior to Admission medications   Medication Sig Start Date End Date Taking? Authorizing Provider  pseudoephedrine (SUDAFED) 30 MG tablet Take 30 mg by mouth every 4 (four) hours as needed for congestion.   Yes Historical Provider, MD  citalopram (CELEXA) 20 MG tablet Take 20 mg by mouth daily.    Historical Provider, MD  doxycycline (VIBRAMYCIN) 100 MG capsule Take 1 capsule (100 mg  total) by mouth 2 (two) times daily. Take with food (Rx void after 09/02/16) 08/25/16   Lattie Haw, MD  enalapril-hydrochlorothiazide (VASERETIC) 10-25 MG tablet Take 1 tablet by mouth daily. 03/22/16   Jade L Breeback, PA-C  ibuprofen (ADVIL,MOTRIN) 800 MG tablet Take 1 tablet (800 mg total) by mouth every 8 (eight) hours as needed. 09/01/15   Jade L Breeback, PA-C  Lorcaserin HCl 10 MG TABS Take 1 tablet by mouth 2 (two) times daily. 03/22/16   Jade L Breeback, PA-C  predniSONE (DELTASONE) 20 MG tablet Take one tab by mouth twice daily for 5 days, then one daily. Take with food. 08/25/16   Lattie Haw, MD  triamcinolone cream (KENALOG) 0.1 % Apply 1 application topically 2 (two) times daily. 03/22/16   Jomarie Longs, PA-C    Family History Family History  Problem Relation Age of Onset  . Hypertension Mother   . Hypertension Father   . Diabetes Father   . Cancer Father     Social History Social History  Substance Use Topics  . Smoking status: Never Smoker  . Smokeless tobacco: Never Used  . Alcohol use Yes     Comment: occasional     Allergies   Imitrex [sumatriptan]   Review of Systems Review of Systems + sore throat + cough No pleuritic pain No wheezing + nasal congestion + post-nasal drainage No sinus pain/pressure No itchy/red eyes No earache  No hemoptysis No SOB No fever, + chills No nausea No vomiting No abdominal pain No diarrhea No urinary symptoms No skin rash + fatigue No myalgias + headache Used OTC meds without relief   Physical Exam Triage Vital Signs ED Triage Vitals  Enc Vitals Group     BP 08/25/16 1258 (!) 156/86     Pulse Rate 08/25/16 1258 88     Resp --      Temp 08/25/16 1258 98.2 F (36.8 C)     Temp Source 08/25/16 1258 Oral     SpO2 08/25/16 1258 97 %     Weight 08/25/16 1259 207 lb (93.9 kg)     Height 08/25/16 1259  (1.676 m)     Head Circumference --      Peak Flow --      Pain Score 08/25/16 1259 5     Pain  Loc --      Pain Edu? --      Excl. in GC? --    No data found.   Updated Vital Signs BP (!) 156/86 (BP Location: Left Arm)   Pulse 88   Temp 98.2 F (36.8 C) (Oral)   Ht  (1.676 m)   Wt 207 lb (93.9 kg)   SpO2 97%   BMI 33.41 kg/m   Visual Acuity Right Eye Distance:   Left Eye Distance:   Bilateral Distance:    Right Eye Near:   Left Eye Near:    Bilateral Near:     Physical Exam Nursing notes and Vital Signs reviewed. Appearance:  Patient appears stated age, and in no acute distress Eyes:  Pupils are equal, round, and reactive to light and accomodation.  Extraocular movement is intact.  Conjunctivae are not inflamed  Ears:  Canals normal.  Tympanic membranes normal.  Nose:  Mildly congested turbinates.  No sinus tenderness.   Pharynx:  Normal Neck:  Supple.  Tender enlarged posterior/lateral nodes are palpated bilaterally  Lungs:  Clear to auscultation.  Breath sounds are equal.  Moving air well. Heart:  Regular rate and rhythm without murmurs, rubs, or gallops.  Abdomen:  Nontender without masses or hepatosplenomegaly.  Bowel sounds are present.  No CVA or flank tenderness.  Extremities:  No edema.  Skin:  No rash present.    UC Treatments / Results  Labs (all labs ordered are listed, but only abnormal results are displayed) Labs Reviewed  POCT RAPID STREP A (OFFICE) negative    EKG  EKG Interpretation None       Radiology No results found.  Procedures Procedures (including critical care time)  Medications Ordered in UC Medications - No data to display   Initial Impression / Assessment and Plan / UC Course  I have reviewed the triage vital signs and the nursing notes.  Pertinent labs & imaging results that were available during my care of the patient were reviewed by me and considered in my medical decision making (see chart for details).    There is no evidence of bacterial infection today.  With patient's history of seasonal rhinitis,  past history of pneumonia, and past history of viral URI's that linger, she probably has a predisposition to mild reactive airways disease that develops during a viral URI. She now has congestion of a viral URI superimposed on her usual seasonal rhinitis.   Will begin prednisone burst/taper.  Take plain guaifenesin (  extended release tabs such as Mucinex) twice daily, with plenty of water, for cough and  congestion.  May continue Pseudoephedrine ( , one or two every 4 to 6 hours) for sinus congestion.  Get adequate rest.   May use Afrin nasal spray (or generic oxymetazoline) each morning for about 5 days and then discontinue.  Also recommend using saline nasal spray several times daily and saline nasal irrigation (AYR is a common brand).  Use Flonase nasal spray each morning after using Afrin nasal spray and saline nasal irrigation. Try warm salt water gargles for sore throat.  Stop all antihistamines for now, and other non-prescription cough/cold preparations. May take Tessalon at bedtime for cough if needed. Begin doxycycline if not improving about one week or if persistent fever develops (Given a prescription to hold, with an expiration date)  Follow-up with family doctor if not improving about10 days.     Final Clinical Impressions(s) / UC Diagnoses   Final diagnoses:  Pharyngitis, unspecified etiology  Viral URI with cough    New Prescriptions New Prescriptions   DOXYCYCLINE (VIBRAMYCIN) 100 MG CAPSULE    Take 1 capsule (100 mg total) by mouth 2 (two) times daily. Take with food (Rx void after 09/02/16)   PREDNISONE (DELTASONE) 20 MG TABLET    Take one tab by mouth twice daily for 5 days, then one daily. Take with food.     Lattie Haw, MD 08/25/16 1344

## 2016-09-04 ENCOUNTER — Other Ambulatory Visit: Payer: Self-pay | Admitting: Physician Assistant

## 2016-09-04 DIAGNOSIS — I10 Essential (primary) hypertension: Secondary | ICD-10-CM

## 2016-10-09 ENCOUNTER — Other Ambulatory Visit: Payer: Self-pay | Admitting: Physician Assistant

## 2016-10-09 DIAGNOSIS — I10 Essential (primary) hypertension: Secondary | ICD-10-CM

## 2016-10-12 ENCOUNTER — Ambulatory Visit (INDEPENDENT_AMBULATORY_CARE_PROVIDER_SITE_OTHER): Payer: PRIVATE HEALTH INSURANCE | Admitting: Physician Assistant

## 2016-10-12 ENCOUNTER — Encounter: Payer: Self-pay | Admitting: Physician Assistant

## 2016-10-12 VITALS — BP 129/83 | HR 74 | Ht 66.0 in | Wt 209.0 lb

## 2016-10-12 DIAGNOSIS — E669 Obesity, unspecified: Secondary | ICD-10-CM

## 2016-10-12 DIAGNOSIS — I10 Essential (primary) hypertension: Secondary | ICD-10-CM | POA: Diagnosis not present

## 2016-10-12 MED ORDER — ENALAPRIL-HYDROCHLOROTHIAZIDE 10-25 MG PO TABS: ORAL_TABLET | ORAL | 5 refills | Status: DC

## 2016-10-12 MED ORDER — PHENTERMINE HCL 37.5 MG PO TABS: 37.5000 mg | ORAL_TABLET | Freq: Every day | ORAL | 0 refills | Status: DC

## 2016-10-12 MED ORDER — IBUPROFEN 800 MG PO TABS: 800.0000 mg | ORAL_TABLET | Freq: Three times a day (TID) | ORAL | 2 refills | Status: AC | PRN

## 2016-10-12 NOTE — Progress Notes (Signed)
   Subjective:    Patient ID: Darlene Melendez, female    DOB: 12/29/1970, 46 y.o.   MRN: [id number]  HPI  Pt is a 46 yo female who presents to the clinic for medication refill.   HTN- she is taking Vaseretic without problems. She is not checking her BP. She denies any SOB, CP, headaches.   She would like to do another round of phentermine. She lost weight over a year ago and slowly gained some of it back. She tolerated phentermine well. She admits she is not dieting or exercising at this point. She tried belviq in the past and helped with cravings but did not have much success with weight loss.   .. Active Ambulatory Problems    Diagnosis Date Noted  . Migraine without aura 03/31/2007  . ESSENTIAL HYPERTENSION, BENIGN 03/31/2007  . GALLSTONES 08/13/2009  . NECK STIFFNESS 06/09/2007  . ROTATOR CUFF SYNDROME, RIGHT 05/27/2008  . Weight gain 03/31/2007  . Obesity 08/13/2014  . Hyperlipidemia 08/16/2014  . Skin macule 01/01/2015  . CAP (community acquired pneumonia) 09/01/2015  . Hypokalemia 09/01/2015  . Cephalalgia 09/01/2015  . Elevated liver enzymes 09/02/2015  . Skin lesion 03/22/2016  . Knee crepitus 03/22/2016  . Primary osteoarthritis of knees, bilateral 03/22/2016  . Hypertriglyceridemia 04/07/2016  . Obesity (BMI 35.0-39.9 without comorbidity) 10/12/2016   Resolved Ambulatory Problems    Diagnosis Date Noted  . Acute sinusitis, unspecified 04/04/2009  . RUQ PAIN 08/06/2009  . VIRAL URI 05/06/2010   Past Medical History:  Diagnosis Date  . Anxiety   . Hypertension   . Migraines   . Pneumonia        Review of Systems  All other systems reviewed and are negative.      Objective:   Physical Exam  Constitutional: She is oriented to person, place, and time. She appears well-developed and well-nourished.  Obese.  HENT:  Head: Normocephalic and atraumatic.  Cardiovascular: Normal rate, regular rhythm and normal heart sounds.   Pulmonary/Chest: Effort normal  and breath sounds normal.  Neurological: She is alert and oriented to person, place, and time.  Psychiatric: She has a normal mood and affect. Her behavior is normal.          Assessment & Plan:  Marland Kitchen.Marland Kitchen.Darlene Melendez was seen today for hypertension.  Diagnoses and all orders for this visit:  Obesity (BMI 35.0-39.9 without comorbidity) -     phentermine (ADIPEX-P) 37.5 MG tablet; Take 1 tablet (37.5 mg total) by mouth daily before breakfast.  Essential hypertension, benign -     enalapril-hydrochlorothiazide (VASERETIC) 10-25 MG tablet; TAKE 1 TABLET BY MOUTH DAILY.  Other orders -     ibuprofen (ADVIL,MOTRIN) 800 MG tablet; Take 1 tablet (800 mg total) by mouth every 8 (eight) hours as needed.   Labs done in December 2017. Looked great.  BP controlled and refills given.   Discussed diet and exercise.  Phentermine started discussed mindset to keep weight off.  SE's discussed.  Follow up in 1 month nurse visit.  3 month office visit.

## 2016-11-18 ENCOUNTER — Ambulatory Visit (INDEPENDENT_AMBULATORY_CARE_PROVIDER_SITE_OTHER): Payer: PRIVATE HEALTH INSURANCE | Admitting: Sports Medicine

## 2016-11-18 DIAGNOSIS — E669 Obesity, unspecified: Secondary | ICD-10-CM | POA: Diagnosis not present

## 2016-11-18 MED ORDER — PHENTERMINE HCL 37.5 MG PO TABS: 37.5000 mg | ORAL_TABLET | Freq: Every day | ORAL | 0 refills | Status: DC

## 2016-11-18 NOTE — Progress Notes (Signed)
Patient doing well on appetite suppressant.  Here for nurse visit, weight, BP, HR check.  Denies problems with insomnia, heart palpitations or tremors.  Satisfied with weight loss thus far and is working on [employer] diet and regular exercise.    Patient lost 9 lbs

## 2016-11-28 ENCOUNTER — Other Ambulatory Visit: Payer: Self-pay | Admitting: Physician Assistant

## 2016-11-28 DIAGNOSIS — E669 Obesity, unspecified: Secondary | ICD-10-CM

## 2016-11-29 ENCOUNTER — Other Ambulatory Visit: Payer: Self-pay | Admitting: Physician Assistant

## 2016-11-29 DIAGNOSIS — E669 Obesity, unspecified: Secondary | ICD-10-CM

## 2016-12-03 IMAGING — DX DG CHEST 2V
2 series · 2 of 2 positions shown · non-contrast
Comparison: PA and lateral chest x-ray September 01, 2015

CLINICAL DATA: Follow-up of pneumonia.

EXAM:
CHEST  2 VIEW

[chest pa]
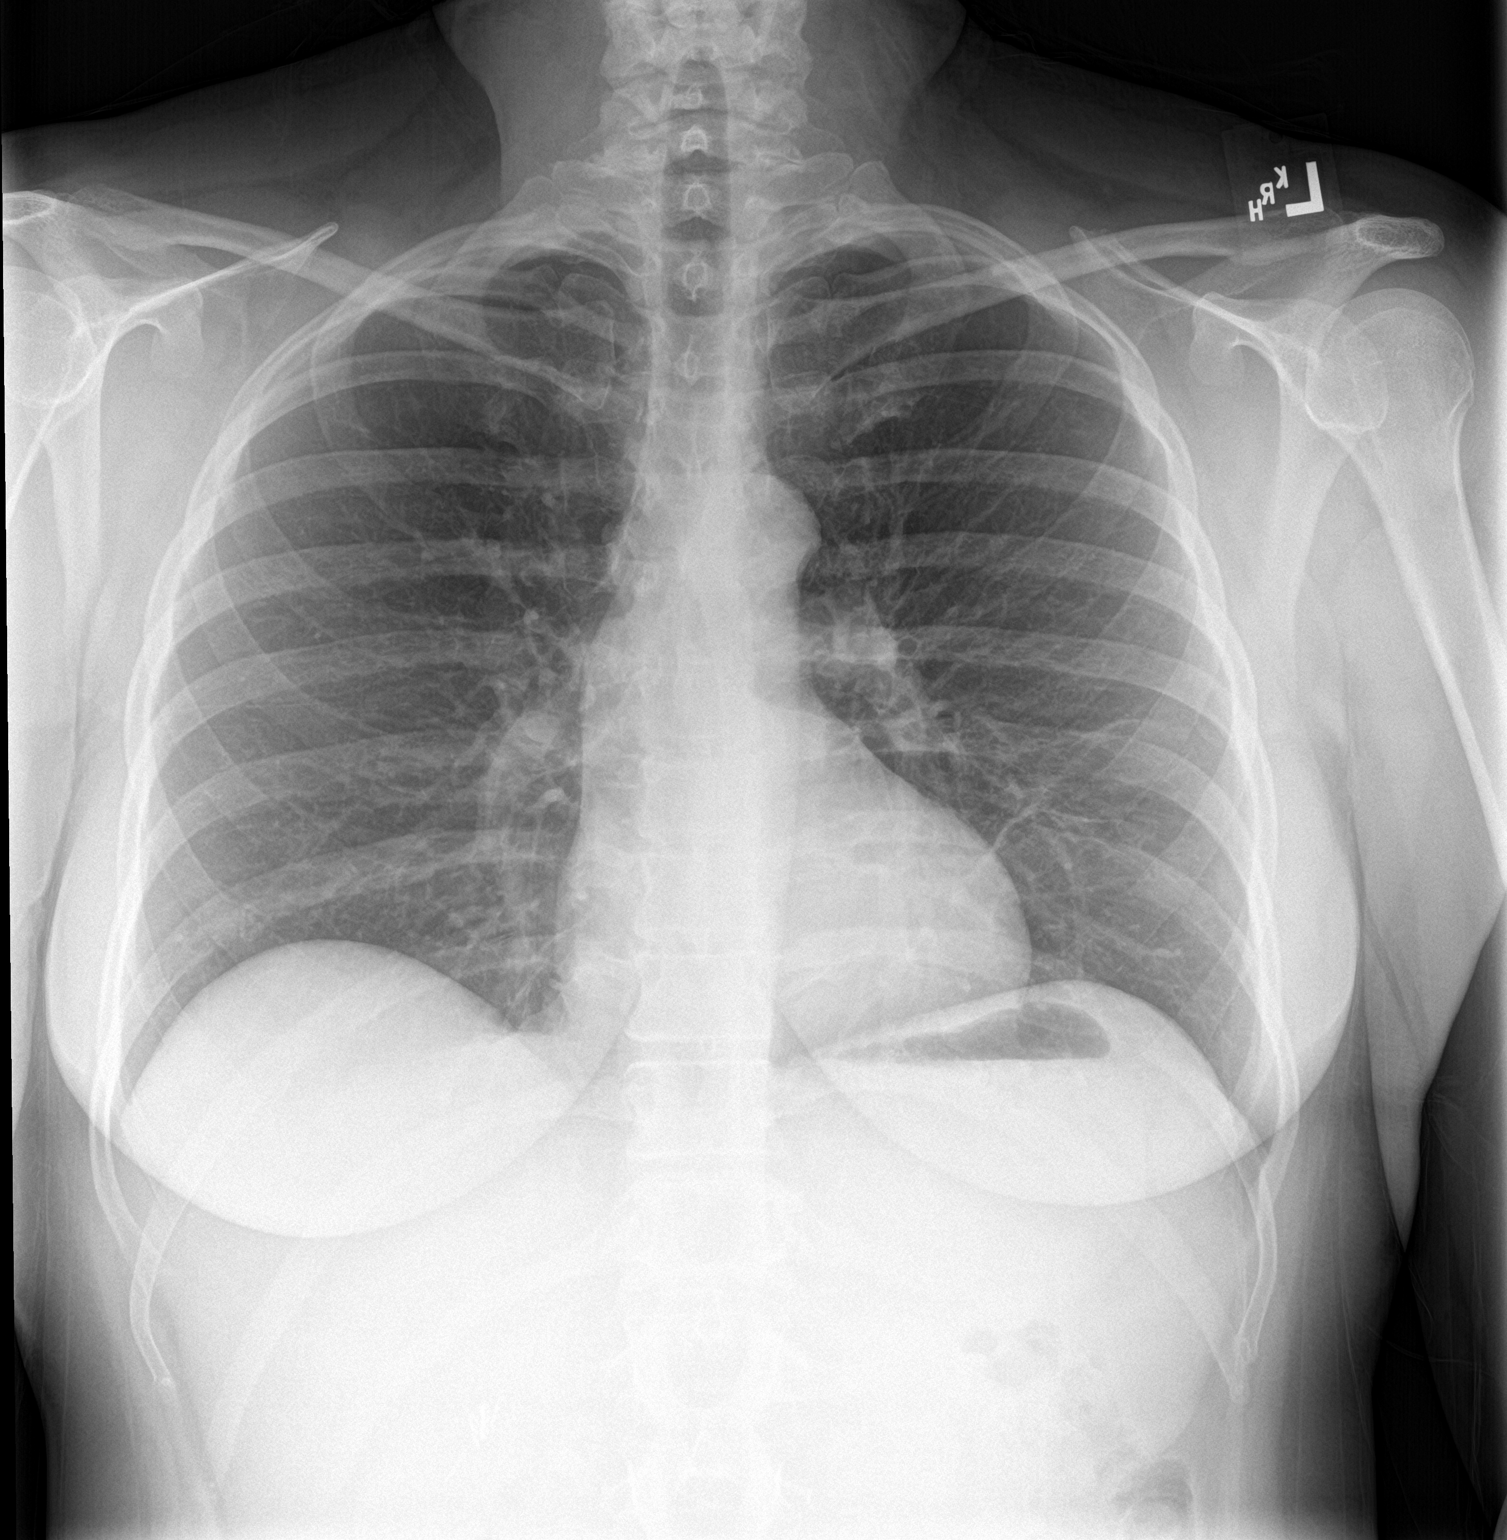

[chest lat]
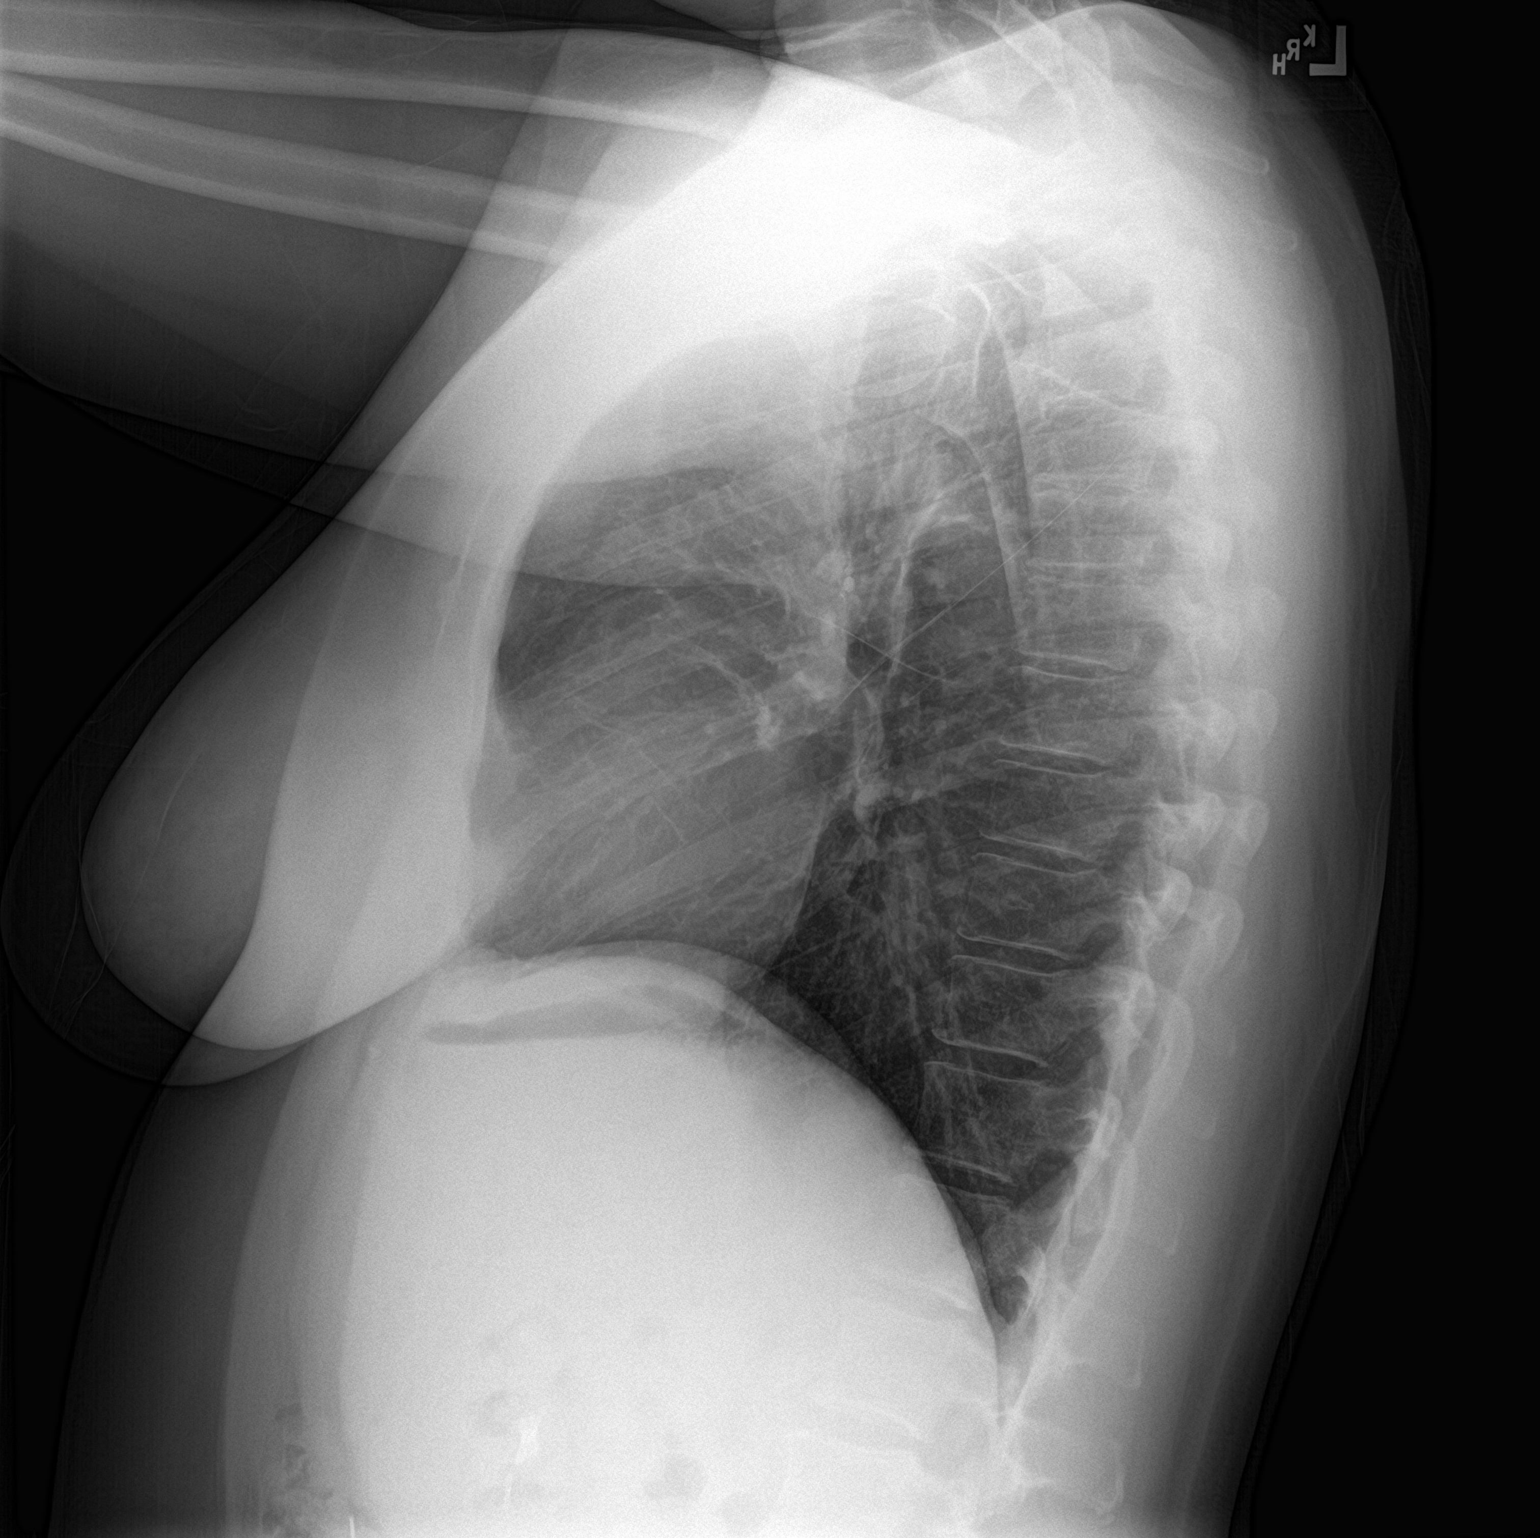

[2 of 2 positions shown; findings below may reference images not displayed]

FINDINGS: The lungs are well-expanded. There has been considerable clearing of
interstitial density at the left lung base. The density at the right
lung base is entirely resolved. The heart is normal in size. The
pulmonary vascularity is not engorged. There is no pleural effusion.
The trachea is midline. The bony thorax is unremarkable.
IMPRESSION: Clearing of interstitial infiltrate in the right lower lung.
Persistent linear density in the lingula may reflect scarring. An
additional follow-up chest x-ray in 3-4 weeks is recommended to
assure complete clearing.

## 2016-12-16 ENCOUNTER — Ambulatory Visit: Payer: PRIVATE HEALTH INSURANCE

## 2017-02-17 ENCOUNTER — Other Ambulatory Visit: Payer: Self-pay | Admitting: *Deleted

## 2017-02-17 DIAGNOSIS — I10 Essential (primary) hypertension: Secondary | ICD-10-CM

## 2017-02-17 MED ORDER — ENALAPRIL-HYDROCHLOROTHIAZIDE 10-25 MG PO TABS: ORAL_TABLET | ORAL | 0 refills | Status: DC

## 2017-04-12 ENCOUNTER — Other Ambulatory Visit: Payer: Self-pay | Admitting: *Deleted

## 2017-04-12 DIAGNOSIS — I10 Essential (primary) hypertension: Secondary | ICD-10-CM

## 2017-04-12 MED ORDER — ENALAPRIL-HYDROCHLOROTHIAZIDE 10-25 MG PO TABS: ORAL_TABLET | ORAL | 0 refills | Status: DC

## 2017-05-02 ENCOUNTER — Encounter: Payer: Self-pay | Admitting: *Deleted

## 2017-05-02 ENCOUNTER — Emergency Department (INDEPENDENT_AMBULATORY_CARE_PROVIDER_SITE_OTHER)
Admission: EM | Admit: 2017-05-02 | Discharge: 2017-05-02 | Disposition: A | Discharge status: Home / Self Care | Payer: PRIVATE HEALTH INSURANCE | Source: Home / Self Care | Attending: Emergency Medicine | Admitting: Emergency Medicine

## 2017-05-02 ENCOUNTER — Other Ambulatory Visit: Payer: Self-pay

## 2017-05-02 DIAGNOSIS — J0100 Acute maxillary sinusitis, unspecified: Secondary | ICD-10-CM

## 2017-05-02 MED ORDER — AMOXICILLIN 875 MG PO TABS: ORAL_TABLET | ORAL | 0 refills | Status: DC

## 2017-05-02 MED ORDER — MOMETASONE FUROATE 50 MCG/ACT NA SUSP: NASAL | 12 refills | Status: DC

## 2017-05-02 MED ORDER — PREDNISONE 50 MG PO TABS: 50.0000 mg | ORAL_TABLET | Freq: Every day | ORAL | 0 refills | Status: DC

## 2017-05-02 NOTE — ED Provider Notes (Signed)
[facility]    CSN: [id number] Arrival date & time: 05/02/17  1805     History   Chief Complaint Chief Complaint  Patient presents with  . Cough  . Nasal Congestion  . Ear Fullness    HPI Darlene Melendez is a 47 y.o. female.   HPI URI HISTORY  Darlene Melendez is a 47 y.o. female who complains of onset of sinus/cold symptoms for several days.  Have been using over-the-counter treatment which helps a little bit.  No chills/sweats +  Fever  +  Nasal congestion +  Discolored Post-nasal drainage Positive sinus pain/pressure No sore throat  +  Cough, she feels from postnasal drainage, occasional yellow sputum coughed up. No wheezing No chest congestion No hemoptysis No shortness of breath No pleuritic pain  No itchy/red eyes Both ears feel full, but No earache  No nausea No vomiting No abdominal pain No diarrhea  No skin rashes +  Fatigue No myalgias No headache   Past Medical History:  Diagnosis Date  . Anxiety   . Hypertension   . Migraines   . Pneumonia     Patient Active Problem List   Diagnosis Date Noted  . Obesity (BMI 35.0-39.9 without comorbidity) 10/12/2016  . Hypertriglyceridemia 04/07/2016  . Skin lesion 03/22/2016  . Knee crepitus 03/22/2016  . Primary osteoarthritis of knees, bilateral 03/22/2016  . Elevated liver enzymes 09/02/2015  . CAP (community acquired pneumonia) 09/01/2015  . Hypokalemia 09/01/2015  . Cephalalgia 09/01/2015  . Skin macule 01/01/2015  . Hyperlipidemia 08/16/2014  . Obesity 08/13/2014  . GALLSTONES 08/13/2009  . ROTATOR CUFF SYNDROME, RIGHT 05/27/2008  . NECK STIFFNESS 06/09/2007  . Migraine without aura 03/31/2007  . ESSENTIAL HYPERTENSION, BENIGN 03/31/2007  . Weight gain 03/31/2007    Past Surgical History:  Procedure Laterality Date  . CHOLECYSTECTOMY  2011    OB History    No data available    BTL in the past   Home Medications    Prior to Admission medications   Medication Sig  Start Date End Date Taking? Authorizing Provider  amoxicillin (AMOXIL) 875 MG tablet Take 1 twice a day X 10 days. 05/02/17   Lajean Manes, MD  citalopram (CELEXA) 20 MG tablet Take 20 mg by mouth daily.    [provider]  enalapril-hydrochlorothiazide (VASERETIC) 10-25 MG tablet TAKE 1 TABLET BY MOUTH DAILY. 04/12/17   Breeback, Lonna Cobb, PA-C  ibuprofen (ADVIL,MOTRIN) 800 MG tablet Take 1 tablet (800 mg total) by mouth every 8 (eight) hours as needed. 10/12/16   Jomarie Longs, PA-C  mometasone (NASONEX) 50 MCG/ACT nasal spray 1 or 2 sprays in each nostril twice daily 05/02/17   Lajean Manes, MD  phentermine (ADIPEX-P) 37.5 MG tablet Take 1 tablet (37.5 mg total) by mouth daily before breakfast. 11/18/16   Monica Becton, MD  predniSONE (DELTASONE) 50 MG tablet Take 1 tablet (50 mg total) by mouth daily. With food for 3 days. 05/02/17   Lajean Manes, MD  triamcinolone cream (KENALOG) 0.1 % Apply 1 application topically 2 (two) times daily. 03/22/16   Jomarie Longs, PA-C    Family History Family History  Problem Relation Age of Onset  . Hypertension Mother   . Hypertension Father   . Diabetes Father   . Cancer Father     Social History Social History   Tobacco Use  . Smoking status: Never Smoker  . Smokeless tobacco: Never Used  Substance Use Topics  . Alcohol use: Yes  Comment: occasional  . Drug use: No     Allergies   Imitrex [sumatriptan]   Review of Systems Review of Systems  All other systems reviewed and are negative.    Physical Exam Triage Vital Signs ED Triage Vitals  Enc Vitals Group     BP      Pulse      Resp      Temp      Temp src      SpO2      Weight      Height      Head Circumference      Peak Flow      Pain Score      Pain Loc      Pain Edu?      Excl. in GC?    No data found.  Updated Vital Signs BP 126/87 (BP Location: Left Arm)   Pulse 84   Temp 98.2 F (36.8 C) (Oral)   Resp 16   Wt 203 lb (92.1 kg)    LMP 04/13/2017   SpO2 98%   BMI 32.77 kg/m   Visual Acuity Right Eye Distance:   Left Eye Distance:   Bilateral Distance:    Right Eye Near:   Left Eye Near:    Bilateral Near:     Physical Exam  Constitutional: She is oriented to person, place, and time. She appears well-developed and well-nourished. No distress.  HENT:  Head: Normocephalic and atraumatic.  Right Ear: Tympanic membrane, external ear and ear canal normal.  Left Ear: Tympanic membrane, external ear and ear canal normal.  Nose: Mucosal edema and rhinorrhea present. Right sinus exhibits maxillary sinus tenderness. Left sinus exhibits maxillary sinus tenderness.  Mouth/Throat: Oropharynx is clear and moist. No oral lesions. No oropharyngeal exudate.  Eyes: Right eye exhibits no discharge. Left eye exhibits no discharge. No scleral icterus.  Neck: Neck supple.  Cardiovascular: Normal rate, regular rhythm and normal heart sounds.  Pulmonary/Chest: Effort normal and breath sounds normal. She has no wheezes. She has no rales.  Lymphadenopathy:    She has no cervical adenopathy.  Neurological: She is alert and oriented to person, place, and time.  Skin: Skin is warm and dry.  Nursing note and vitals reviewed.    UC Treatments / Results  Labs (all labs ordered are listed, but only abnormal results are displayed) Labs Reviewed - No data to display  EKG  EKG Interpretation None       Radiology No results found.  Procedures Procedures (including critical care time)  Medications Ordered in UC Medications - No data to display   Initial Impression / Assessment and Plan / UC Course  I have reviewed the triage vital signs and the nursing notes.  Pertinent labs & imaging results that were available during my care of the patient were reviewed by me and considered in my medical decision making (see chart for details).       Final Clinical Impressions(s) / UC Diagnoses   Final diagnoses:  Acute maxillary  sinusitis, recurrence not specified   Treatment options discussed, as well as risks, benefits, alternatives. Patient voiced understanding and agreement with the following plans:  ED Discharge Orders        Ordered    amoxicillin (AMOXIL) 875 MG tablet     05/02/17 1838    mometasone (NASONEX) 50 MCG/ACT nasal spray     05/02/17 1838    predniSONE (DELTASONE) 50 MG tablet  Daily  05/02/17 1838     Follow-up with your primary care doctor in 5-7 days if not improving, or sooner if symptoms become worse. Precautions discussed. Red flags discussed. Questions invited and answered. Patient voiced understanding and agreement.   Controlled Substance Prescriptions [place] Controlled Substance Registry consulted? Not Applicable   Lajean Manes, MD 05/05/17 1504

## 2017-05-02 NOTE — ED Triage Notes (Signed)
Patient c/o 4 days of productive cough, nasal congestion, ear fullness. Afebrile. Taken Mucinex DM, and Nyquil.

## 2017-05-12 ENCOUNTER — Other Ambulatory Visit: Payer: Self-pay

## 2017-05-12 ENCOUNTER — Emergency Department (INDEPENDENT_AMBULATORY_CARE_PROVIDER_SITE_OTHER)
Admission: EM | Admit: 2017-05-12 | Discharge: 2017-05-12 | Disposition: A | Discharge status: Home / Self Care | Payer: PRIVATE HEALTH INSURANCE | Source: Home / Self Care | Attending: Family Medicine | Admitting: Family Medicine

## 2017-05-12 ENCOUNTER — Encounter: Payer: Self-pay | Admitting: Emergency Medicine

## 2017-05-12 ENCOUNTER — Emergency Department (INDEPENDENT_AMBULATORY_CARE_PROVIDER_SITE_OTHER): Payer: PRIVATE HEALTH INSURANCE

## 2017-05-12 DIAGNOSIS — B9789 Other viral agents as the cause of diseases classified elsewhere: Secondary | ICD-10-CM | POA: Diagnosis not present

## 2017-05-12 DIAGNOSIS — R0981 Nasal congestion: Secondary | ICD-10-CM

## 2017-05-12 DIAGNOSIS — J069 Acute upper respiratory infection, unspecified: Secondary | ICD-10-CM | POA: Diagnosis not present

## 2017-05-12 DIAGNOSIS — R05 Cough: Secondary | ICD-10-CM

## 2017-05-12 MED ORDER — IPRATROPIUM BROMIDE 0.06 % NA SOLN: 2.0000 | Freq: Four times a day (QID) | NASAL | 1 refills | Status: DC

## 2017-05-12 MED ORDER — BENZONATATE 100 MG PO CAPS: 100.0000 mg | ORAL_CAPSULE | Freq: Three times a day (TID) | ORAL | 0 refills | Status: DC

## 2017-05-12 NOTE — Discharge Instructions (Signed)
°  For the Ipratropium nasal spray, be sure to use as prescribed to help prevent post-nasal drip, which can trigger coughing, especially at night. ° °Use 2 sprays per nostril 4 times daily while sick.  You should spray one time in each nostril pointing the spray to the out portion of your nostril, breath in slowly while spraying. Wait about 30 seconds to 1 minute before given the second spray in each nostril.  This will ensure you get the most benefit from each spray.   ° °

## 2017-05-12 NOTE — ED Provider Notes (Signed)
[facility]    CSN: [id number] Arrival date & time: 05/12/17  1441     History   Chief Complaint Chief Complaint  Patient presents with  . Cough    HPI Darlene Melendez is a 47 y.o. female.   HPI Darlene Melendez is a 47 y.o. female presenting to UC with c/o continued nasal congestion and mild intermittent cough that tends to be worse at night.  She was seen at [facility] on 05/02/17 with similar symptoms, placed on amoxicillin and prednisone but she does not feel much better. Denies HA, fever, chills, n/v/d. Denies chest pain or SOB but does have a hx of pneumonia and is requesting a CXR to ensure she does not have it again.  She does take Nyquil, which helps with cough at night but has not taken any OTC medications within the last 4 hours.    Past Medical History:  Diagnosis Date  . Anxiety   . Hypertension   . Migraines   . Pneumonia     Patient Active Problem List   Diagnosis Date Noted  . Obesity (BMI 35.0-39.9 without comorbidity) 10/12/2016  . Hypertriglyceridemia 04/07/2016  . Skin lesion 03/22/2016  . Knee crepitus 03/22/2016  . Primary osteoarthritis of knees, bilateral 03/22/2016  . Elevated liver enzymes 09/02/2015  . CAP (community acquired pneumonia) 09/01/2015  . Hypokalemia 09/01/2015  . Cephalalgia 09/01/2015  . Skin macule 01/01/2015  . Hyperlipidemia 08/16/2014  . Obesity 08/13/2014  . GALLSTONES 08/13/2009  . ROTATOR CUFF SYNDROME, RIGHT 05/27/2008  . NECK STIFFNESS 06/09/2007  . Migraine without aura 03/31/2007  . ESSENTIAL HYPERTENSION, BENIGN 03/31/2007  . Weight gain 03/31/2007    Past Surgical History:  Procedure Laterality Date  . CHOLECYSTECTOMY  2011    OB History    No data available       Home Medications    Prior to Admission medications   Medication Sig Start Date End Date Taking? Authorizing Provider  amoxicillin (AMOXIL) 875 MG tablet Take 1 twice a day X 10 days. 05/02/17   Lajean Manes, MD  benzonatate  (TESSALON) 100 MG capsule Take 1-2 capsules (100-200 mg total) by mouth every 8 (eight) hours. 05/12/17   Lurene Shadow, PA-C  citalopram (CELEXA) 20 MG tablet Take 20 mg by mouth daily.    [provider]  enalapril-hydrochlorothiazide (VASERETIC) 10-25 MG tablet TAKE 1 TABLET BY MOUTH DAILY. 04/12/17   Breeback, Lonna Cobb, PA-C  ibuprofen (ADVIL,MOTRIN) 800 MG tablet Take 1 tablet (800 mg total) by mouth every 8 (eight) hours as needed. 10/12/16   Breeback, Jade L, PA-C  ipratropium (ATROVENT) 0.06 % nasal spray Place 2 sprays into both nostrils 4 (four) times daily. 05/12/17   Lurene Shadow, PA-C  mometasone (NASONEX) 50 MCG/ACT nasal spray 1 or 2 sprays in each nostril twice daily 05/02/17   Lajean Manes, MD  phentermine (ADIPEX-P) 37.5 MG tablet Take 1 tablet (37.5 mg total) by mouth daily before breakfast. 11/18/16   Monica Becton, MD  predniSONE (DELTASONE) 50 MG tablet Take 1 tablet (50 mg total) by mouth daily. With food for 3 days. 05/02/17   Lajean Manes, MD  triamcinolone cream (KENALOG) 0.1 % Apply 1 application topically 2 (two) times daily. 03/22/16   Jomarie Longs, PA-C    Family History Family History  Problem Relation Age of Onset  . Hypertension Mother   . Hypertension Father   . Diabetes Father   . Cancer Father  Social History Social History   Tobacco Use  . Smoking status: Never Smoker  . Smokeless tobacco: Never Used  Substance Use Topics  . Alcohol use: Yes    Comment: occasional  . Drug use: No     Allergies   Imitrex [sumatriptan]   Review of Systems Review of Systems  Constitutional: Negative for chills and fever.  HENT: Positive for congestion, postnasal drip and rhinorrhea. Negative for ear pain, sinus pressure, sinus pain, sore throat, trouble swallowing and voice change.   Respiratory: Positive for cough. Negative for shortness of breath.   Cardiovascular: Negative for chest pain and palpitations.  Gastrointestinal: Negative  for abdominal pain, diarrhea, nausea and vomiting.  Musculoskeletal: Negative for arthralgias, back pain and myalgias.  Skin: Negative for rash.  Neurological: Negative for dizziness, light-headedness and headaches.     Physical Exam Triage Vital Signs ED Triage Vitals  Enc Vitals Group     BP 05/12/17 1502 108/73     Pulse Rate 05/12/17 1502 68     Resp 05/12/17 1502 16     Temp 05/12/17 1502 97.8 F (36.6 C)     Temp Source 05/12/17 1502 Oral     SpO2 05/12/17 1502 100 %     Weight 05/12/17 1503 200 lb (90.7 kg)     Height 05/12/17 1503 5\' 6"  (1.676 m)     Head Circumference --      Peak Flow --      Pain Score --      Pain Loc --      Pain Edu? --      Excl. in GC? --    No data found.  Updated Vital Signs BP 108/73 (BP Location: Right Arm)   Pulse 68   Temp 97.8 F (36.6 C) (Oral)   Resp 16   Ht 5\' 6"  (1.676 m)   Wt 200 lb (90.7 kg)   LMP 04/13/2017   SpO2 100%   BMI 32.28 kg/m   Visual Acuity Right Eye Distance:   Left Eye Distance:   Bilateral Distance:    Right Eye Near:   Left Eye Near:    Bilateral Near:     Physical Exam  Constitutional: She is oriented to person, place, and time. She appears well-developed and well-nourished. No distress.  HENT:  Head: Normocephalic and atraumatic.  Right Ear: Tympanic membrane normal.  Left Ear: Tympanic membrane normal.  Nose: Mucosal edema present. Right sinus exhibits no maxillary sinus tenderness and no frontal sinus tenderness. Left sinus exhibits no maxillary sinus tenderness and no frontal sinus tenderness.  Mouth/Throat: Uvula is midline, oropharynx is clear and moist and mucous membranes are normal.  Eyes: EOM are normal.  Neck: Normal range of motion. Neck supple.  Cardiovascular: Normal rate and regular rhythm.  Pulmonary/Chest: Effort normal and breath sounds normal. No stridor. No respiratory distress. She has no wheezes. She has no rales.  Musculoskeletal: Normal range of motion.    Lymphadenopathy:    She has no cervical adenopathy.  Neurological: She is alert and oriented to person, place, and time.  Skin: Skin is warm and dry. She is not diaphoretic.  Psychiatric: She has a normal mood and affect. Her behavior is normal.  Nursing note and vitals reviewed.    UC Treatments / Results  Labs (all labs ordered are listed, but only abnormal results are displayed) Labs Reviewed - No data to display  EKG  EKG Interpretation None       Radiology Dg Chest  2 View  Result Date: 05/12/2017 CLINICAL DATA:  Cough for 2 weeks EXAM: CHEST  2 VIEW COMPARISON:  Sep 17, 2015 FINDINGS: There is minimal scarring in the left base. There is no edema or consolidation. The heart size and pulmonary vascularity are normal. No adenopathy. No bone lesions. IMPRESSION: Minimal scarring left base.  No edema or consolidation. Electronically Signed   By: Bretta Bang III M.D.   On: 05/12/2017 15:21    Procedures Procedures (including critical care time)  Medications Ordered in UC Medications - No data to display   Initial Impression / Assessment and Plan / UC Course  I have reviewed the triage vital signs and the nursing notes.  Pertinent labs & imaging results that were available during my care of the patient were reviewed by me and considered in my medical decision making (see chart for details).     Vitals: WNL CXR: minimal scarring in left base w/o edema or consolidation.    Reassured pt, no pneumonia.  Will treat congestion symptomatically  Pt has done well with Tessalon in the past for cough Nasonex that was prescribed last visit was not covered by pt's insurance, will have pt try ipratropium nasal spray Encouraged sinus rinses F/u with PCP in 1 week if not improving.   Final Clinical Impressions(s) / UC Diagnoses   Final diagnoses:  Viral URI with cough  Nasal congestion    ED Discharge Orders        Ordered    ipratropium (ATROVENT) 0.06 % nasal spray   4 times daily     05/12/17 1535    benzonatate (TESSALON) 100 MG capsule  Every 8 hours     05/12/17 1535       Controlled Substance Prescriptions [place] Controlled Substance Registry consulted? Not Applicable   Rolla Plate 05/12/17 1605

## 2017-05-12 NOTE — ED Triage Notes (Signed)
Patient was here 05/02/17 for congestion and cough; placed on amoxicillin; has worsened; has hx of pneumonia and would like chest image. No OTCs past 4 hours.

## 2017-05-14 ENCOUNTER — Telehealth: Payer: Self-pay | Admitting: Emergency Medicine

## 2017-05-14 NOTE — Telephone Encounter (Signed)
Inquired about patient's status; encourage them to call with questions/concerns.  

## 2017-05-30 ENCOUNTER — Emergency Department (INDEPENDENT_AMBULATORY_CARE_PROVIDER_SITE_OTHER)
Admission: EM | Admit: 2017-05-30 | Discharge: 2017-05-30 | Disposition: A | Discharge status: Home / Self Care | Payer: PRIVATE HEALTH INSURANCE | Source: Home / Self Care | Attending: Sports Medicine | Admitting: Sports Medicine

## 2017-05-30 ENCOUNTER — Encounter: Payer: Self-pay | Admitting: Emergency Medicine

## 2017-05-30 DIAGNOSIS — J111 Influenza due to unidentified influenza virus with other respiratory manifestations: Secondary | ICD-10-CM

## 2017-05-30 DIAGNOSIS — R69 Illness, unspecified: Secondary | ICD-10-CM

## 2017-05-30 MED ORDER — BENZONATATE 200 MG PO CAPS: 200.0000 mg | ORAL_CAPSULE | Freq: Three times a day (TID) | ORAL | 0 refills | Status: DC | PRN

## 2017-05-30 MED ORDER — OSELTAMIVIR PHOSPHATE 75 MG PO CAPS: 75.0000 mg | ORAL_CAPSULE | Freq: Two times a day (BID) | ORAL | 0 refills | Status: DC

## 2017-05-30 NOTE — ED Provider Notes (Signed)
  [facility]    CSN: [id number] Arrival date & time: 05/30/17  1733   Subjective:    CC: Feeling sick  HPI: For the past day this pleasant 47 year old female has had cough, minimally productive, mild sore throat, muscle aches, body aches, fevers to 101-102 F.  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  No shortness of breath, chest pain, no GI symptoms, no skin rash.  Symptoms are severe, persistent.  I reviewed the past medical history, family history, social history, surgical history, and allergies today and no changes were needed.  Please see the problem list section below in epic for further details.  Past Medical History: Past Medical History:  Diagnosis Date  . Anxiety   . Hypertension   . Migraines   . Pneumonia    Past Surgical History: Past Surgical History:  Procedure Laterality Date  . CHOLECYSTECTOMY  2011   Social History: Social History   Socioeconomic History  . Marital status: Married    Spouse name: Not on file  . Number of children: Not on file  . Years of education: Not on file  . Highest education level: Not on file  Social Needs  . Financial resource strain: Not on file  . Food insecurity - worry: Not on file  . Food insecurity - inability: Not on file  . Transportation needs - medical: Not on file  . Transportation needs - non-medical: Not on file  Occupational History  . Not on file  Tobacco Use  . Smoking status: Never Smoker  . Smokeless tobacco: Never Used  Substance and Sexual Activity  . Alcohol use: Yes    Comment: occasional  . Drug use: No  . Sexual activity: Yes    Birth control/protection: None  Other Topics Concern  . Not on file  Social History Narrative  . Not on file   Family History: Family History  Problem Relation Age of Onset  . Hypertension Mother   . Hypertension Father   . Diabetes Father   . Cancer Father    Allergies: Allergies  Allergen Reactions  . Imitrex [Sumatriptan]    Nauseated.   Medications: See med rec.  Review of Systems: No fevers, chills, night sweats, weight loss, chest pain, or shortness of breath.   Objective:    General: Well Developed, well nourished, and in no acute distress.  Neuro: Alert and oriented x3, extra-ocular muscles intact, sensation grossly intact.  HEENT: Normocephalic, atraumatic, pupils equal round reactive to light, neck supple, no masses, no lymphadenopathy, thyroid nonpalpable.  Oropharynx, nasopharynx, ear canals unremarkable Skin: Warm and dry, no rashes. Cardiac: Regular rate and rhythm, no murmurs rubs or gallops, no lower extremity edema.  Respiratory: Clear to auscultation bilaterally. Not using accessory muscles, speaking in full sentences.  Impression and Recommendations:    Influenza-like illness, adding Tamiflu, Tessalon Perles, she can use over-the-counter cold and flu medications for symptomatic relief.  Out of work until Friday per her request. ___________________________________________ Ihor Austinhomas J. Benjamin Stainhekkekandam, M.D., ABFM., CAQSM. Primary Care and Sports Medicine [place] [facility]  Adjunct Instructor of Family Medicine  [employer]    Monica Bectonhekkekandam, Charlisa Cham J, MD 05/30/17 [phone]

## 2017-05-30 NOTE — ED Triage Notes (Signed)
Pt c/o cough, fever and body aches that started yesterday. Known flu exposure last week.

## 2017-06-30 ENCOUNTER — Other Ambulatory Visit: Payer: Self-pay | Admitting: *Deleted

## 2017-06-30 ENCOUNTER — Other Ambulatory Visit: Payer: Self-pay | Admitting: Physician Assistant

## 2017-06-30 DIAGNOSIS — I10 Essential (primary) hypertension: Secondary | ICD-10-CM

## 2017-07-28 LAB — HM PAP SMEAR

## 2017-07-29 LAB — HM MAMMOGRAPHY

## 2017-08-02 ENCOUNTER — Other Ambulatory Visit: Payer: Self-pay | Admitting: Physician Assistant

## 2017-08-02 DIAGNOSIS — I10 Essential (primary) hypertension: Secondary | ICD-10-CM

## 2017-08-30 ENCOUNTER — Other Ambulatory Visit: Payer: Self-pay | Admitting: Physician Assistant

## 2017-08-30 DIAGNOSIS — I10 Essential (primary) hypertension: Secondary | ICD-10-CM

## 2017-08-31 ENCOUNTER — Other Ambulatory Visit: Payer: Self-pay

## 2017-08-31 DIAGNOSIS — I10 Essential (primary) hypertension: Secondary | ICD-10-CM

## 2017-08-31 MED ORDER — ENALAPRIL-HYDROCHLOROTHIAZIDE 10-25 MG PO TABS: ORAL_TABLET | ORAL | 0 refills | Status: DC

## 2017-08-31 NOTE — Telephone Encounter (Signed)
Sending in 1 week of BP meds to hold pt over until her appt next week.   Pt aware she must keep appt to get further refills

## 2017-09-07 ENCOUNTER — Telehealth: Payer: Self-pay | Admitting: Physician Assistant

## 2017-09-07 ENCOUNTER — Encounter: Payer: Self-pay | Admitting: Physician Assistant

## 2017-09-07 ENCOUNTER — Ambulatory Visit (INDEPENDENT_AMBULATORY_CARE_PROVIDER_SITE_OTHER): Payer: PRIVATE HEALTH INSURANCE | Admitting: Physician Assistant

## 2017-09-07 VITALS — BP 133/63 | HR 72 | Ht 66.0 in | Wt 209.0 lb

## 2017-09-07 DIAGNOSIS — I10 Essential (primary) hypertension: Secondary | ICD-10-CM | POA: Diagnosis not present

## 2017-09-07 DIAGNOSIS — Q828 Other specified congenital malformations of skin: Secondary | ICD-10-CM

## 2017-09-07 MED ORDER — ENALAPRIL-HYDROCHLOROTHIAZIDE 10-25 MG PO TABS: ORAL_TABLET | ORAL | 11 refills | Status: DC

## 2017-09-07 NOTE — Telephone Encounter (Signed)
[facility] this April.

## 2017-09-07 NOTE — Telephone Encounter (Signed)
Fax request placed with front office. 

## 2017-09-07 NOTE — Progress Notes (Signed)
Subjective:    Patient ID: Darlene Melendez, female    DOB: 1970/08/30, 47 y.o.   MRN: [id number]  HPI Patient is a 47 year old female who presents to the clinic for blood pressure follow-up.  Hypertension-patient denies any chest pains, palpitations, headaches or vision changes.  She is taking her medication daily with no problems or concerns.  Patient does have some skin tags that are bothersome and irritated by her bra, necklace, and shaving.  She would like removed today.  .. Active Ambulatory Problems    Diagnosis Date Noted  . Migraine without aura 03/31/2007  . ESSENTIAL HYPERTENSION, BENIGN 03/31/2007  . GALLSTONES 08/13/2009  . NECK STIFFNESS 06/09/2007  . ROTATOR CUFF SYNDROME, RIGHT 05/27/2008  . Weight gain 03/31/2007  . Obesity 08/13/2014  . Hyperlipidemia 08/16/2014  . Skin macule 01/01/2015  . CAP (community acquired pneumonia) 09/01/2015  . Hypokalemia 09/01/2015  . Cephalalgia 09/01/2015  . Elevated liver enzymes 09/02/2015  . Skin lesion 03/22/2016  . Knee crepitus 03/22/2016  . Primary osteoarthritis of knees, bilateral 03/22/2016  . Hypertriglyceridemia 04/07/2016  . Obesity (BMI 35.0-39.9 without comorbidity) 10/12/2016  . Influenza-like illness 05/30/2017  . Accessory skin tags 09/07/2017   Resolved Ambulatory Problems    Diagnosis Date Noted  . Acute sinusitis, unspecified 04/04/2009  . RUQ PAIN 08/06/2009  . VIRAL URI 05/06/2010   Past Medical History:  Diagnosis Date  . Anxiety   . Hypertension   . Migraines   . Pneumonia       Review of Systems  All other systems reviewed and are negative.      Objective:   Physical Exam  Constitutional: She is oriented to person, place, and time. She appears well-developed and well-nourished.  HENT:  Head: Normocephalic and atraumatic.  Cardiovascular: Normal rate and regular rhythm.  Pulmonary/Chest: Effort normal and breath sounds normal.  Musculoskeletal: Normal range of motion.   Neurological: She is alert and oriented to person, place, and time.  Skin:  Acrochordons of bilateral axilla and underneath both breast.          Assessment & Plan:   Marland KitchenMarland KitchenZanita was seen today for hypertension.  Diagnoses and all orders for this visit:  Essential hypertension, benign -     enalapril-hydrochlorothiazide (VASERETIC) 10-25 MG tablet; TAKE 1 TABLET BY MOUTH EVERY DAY.  Accessory skin tags     Blood pressure is controlled today.  I did go ahead and give her a refill for 1 year.  We do need up-to-date labs.  She stated she got these done at work and would drop off copy of labs.  I explained to her the importance of following up on her kidney, liver, glucose, cholesterol to assess her overall risk.  She expressed knowledge of this and would get them to [place].  Patient [facility] will [facility] to get that results.  Skin Tag Removal Procedure Note  Pre-operative Diagnosis: Classic skin tags (acrochordon)  Post-operative Diagnosis: Classic skin tags (acrochordon)  Locations: bilateral axilla and bilateral underneath breast  Indications: irritation  Procedure Details  The risks (including bleeding and infection) and benefits of the procedure and Verbal informed consent obtained. Using sterile iris scissors, multiple skin tags were snipped off at their bases after cleansing with Betadine.  Bleeding was controlled by pressure.   Findings: Pathognomonic benign lesions  not sent for pathological exam.  Condition: Stable  Complications: none.  Plan: 1. Instructed to keep the wounds dry and covered for 24-48h  and clean thereafter. 2. Warning signs of infection were reviewed.   3. Recommended that the patient use OTC acetaminophen as needed for pain.  4. Return as needed.

## 2017-09-21 ENCOUNTER — Encounter: Payer: Self-pay | Admitting: Physician Assistant

## 2017-11-20 ENCOUNTER — Other Ambulatory Visit: Payer: Self-pay | Admitting: Physician Assistant

## 2017-11-20 DIAGNOSIS — I10 Essential (primary) hypertension: Secondary | ICD-10-CM

## 2018-01-10 ENCOUNTER — Other Ambulatory Visit: Payer: Self-pay

## 2018-01-10 ENCOUNTER — Emergency Department (INDEPENDENT_AMBULATORY_CARE_PROVIDER_SITE_OTHER)
Admission: EM | Admit: 2018-01-10 | Discharge: 2018-01-10 | Disposition: A | Discharge status: Home / Self Care | Payer: PRIVATE HEALTH INSURANCE | Source: Home / Self Care | Attending: Family Medicine | Admitting: Family Medicine

## 2018-01-10 DIAGNOSIS — B9789 Other viral agents as the cause of diseases classified elsewhere: Secondary | ICD-10-CM

## 2018-01-10 DIAGNOSIS — R0981 Nasal congestion: Secondary | ICD-10-CM

## 2018-01-10 DIAGNOSIS — J069 Acute upper respiratory infection, unspecified: Secondary | ICD-10-CM

## 2018-01-10 DIAGNOSIS — J040 Acute laryngitis: Secondary | ICD-10-CM

## 2018-01-10 MED ORDER — IPRATROPIUM BROMIDE 0.06 % NA SOLN: 2.0000 | Freq: Four times a day (QID) | NASAL | 1 refills | Status: DC

## 2018-01-10 MED ORDER — BENZONATATE 100 MG PO CAPS: 100.0000 mg | ORAL_CAPSULE | Freq: Three times a day (TID) | ORAL | 0 refills | Status: DC

## 2018-01-10 MED ORDER — AZITHROMYCIN 250 MG PO TABS: 250.0000 mg | ORAL_TABLET | Freq: Every day | ORAL | 0 refills | Status: DC

## 2018-01-10 NOTE — ED Provider Notes (Signed)
[facility]    CSN: [id number] Arrival date & time: 01/10/18  0959     History   Chief Complaint Chief Complaint  Patient presents with  . Cough  . Nasal Congestion  . Ear Fullness    HPI Darlene Melendez is a 47 y.o. female.   HPI  Darlene Melendez is a 47 y.o. female presenting to UC with c/o 4-5 days of nasal congestion and bilateral ear fullness. She developed a cough yesterday with a hoarse voice. Cough is mildly productive with green nasal discharge. Denies sore throat. Denies fever, chills, n/v/d. No known sick contacts. She has tried mucinex and OTC allergy medication w/o relief.    Past Medical History:  Diagnosis Date  . Anxiety   . Hypertension   . Migraines   . Pneumonia     Patient Active Problem List   Diagnosis Date Noted  . Accessory skin tags 09/07/2017  . Influenza-like illness 05/30/2017  . Obesity (BMI 35.0-39.9 without comorbidity) 10/12/2016  . Hypertriglyceridemia 04/07/2016  . Skin lesion 03/22/2016  . Knee crepitus 03/22/2016  . Primary osteoarthritis of knees, bilateral 03/22/2016  . Elevated liver enzymes 09/02/2015  . CAP (community acquired pneumonia) 09/01/2015  . Hypokalemia 09/01/2015  . Cephalalgia 09/01/2015  . Skin macule 01/01/2015  . Hyperlipidemia 08/16/2014  . Obesity 08/13/2014  . GALLSTONES 08/13/2009  . ROTATOR CUFF SYNDROME, RIGHT 05/27/2008  . NECK STIFFNESS 06/09/2007  . Migraine without aura 03/31/2007  . ESSENTIAL HYPERTENSION, BENIGN 03/31/2007  . Weight gain 03/31/2007    Past Surgical History:  Procedure Laterality Date  . CHOLECYSTECTOMY  2011    OB History   None      Home Medications    Prior to Admission medications   Medication Sig Start Date End Date Taking? Authorizing Provider  azithromycin (ZITHROMAX) 250 MG tablet Take 1 tablet (250 mg total) by mouth daily. Take first 2 tablets together, then 1 every day until finished. 01/10/18   Lurene Shadow, PA-C  benzonatate  (TESSALON) 100 MG capsule Take 1-2 capsules (100-200 mg total) by mouth every 8 (eight) hours. 01/10/18   Lurene Shadow, PA-C  citalopram (CELEXA) 20 MG tablet Take 20 mg by mouth daily.    [provider]  enalapril-hydrochlorothiazide (VASERETIC) 10-25 MG tablet TAKE 1 TABLET BY MOUTH EVERY DAY 11/22/17   Breeback, Jade L, PA-C  ibuprofen (ADVIL,MOTRIN) 800 MG tablet Take 1 tablet (800 mg total) by mouth every 8 (eight) hours as needed. 10/12/16   Breeback, Jade L, PA-C  ipratropium (ATROVENT) 0.06 % nasal spray Place 2 sprays into both nostrils 4 (four) times daily. 01/10/18   Lurene Shadow, PA-C    Family History Family History  Problem Relation Age of Onset  . Hypertension Mother   . Hypertension Father   . Diabetes Father   . Cancer Father     Social History Social History   Tobacco Use  . Smoking status: Never Smoker  . Smokeless tobacco: Never Used  Substance Use Topics  . Alcohol use: Yes    Comment: occasional  . Drug use: No     Allergies   Imitrex [sumatriptan]   Review of Systems Review of Systems  Constitutional: Negative for chills and fever.  HENT: Positive for congestion, ear pain, postnasal drip, rhinorrhea, sinus pressure, sinus pain and voice change. Negative for sore throat and trouble swallowing.   Respiratory: Positive for cough. Negative for shortness of breath.   Cardiovascular: Negative for chest pain and  palpitations.  Gastrointestinal: Negative for abdominal pain, diarrhea, nausea and vomiting.  Musculoskeletal: Negative for arthralgias, back pain and myalgias.  Skin: Negative for rash.     Physical Exam Triage Vital Signs ED Triage Vitals  Enc Vitals Group     BP 01/10/18 1051 128/82     Pulse Rate 01/10/18 1051 72     Resp --      Temp 01/10/18 1051 98.5 F (36.9 C)     Temp Source 01/10/18 1051 Oral     SpO2 01/10/18 1051 98 %     Weight 01/10/18 1052 206 lb (93.4 kg)     Height 01/10/18 1052 5\' 6"  (1.676 m)     Head  Circumference --      Peak Flow --      Pain Score 01/10/18 1051 0     Pain Loc --      Pain Edu? --      Excl. in GC? --    No data found.  Updated Vital Signs BP 128/82 (BP Location: Right Arm)   Pulse 72   Temp 98.5 F (36.9 C) (Oral)   Ht 5\' 6"  (1.676 m)   Wt 206 lb (93.4 kg)   LMP 12/16/2017   SpO2 98%   BMI 33.25 kg/m   Visual Acuity Right Eye Distance:   Left Eye Distance:   Bilateral Distance:    Right Eye Near:   Left Eye Near:    Bilateral Near:     Physical Exam  Constitutional: She appears well-developed and well-nourished. No distress.  HENT:  Head: Normocephalic and atraumatic.  Right Ear: Tympanic membrane normal.  Left Ear: Tympanic membrane normal.  Nose: Mucosal edema present. Right sinus exhibits no maxillary sinus tenderness and no frontal sinus tenderness. Left sinus exhibits no maxillary sinus tenderness and no frontal sinus tenderness.  Mouth/Throat: Uvula is midline, oropharynx is clear and moist and mucous membranes are normal.  Eyes: Conjunctivae are normal. No scleral icterus.  Neck: Normal range of motion. Neck supple.  Cardiovascular: Normal rate and regular rhythm.  Pulmonary/Chest: Effort normal and breath sounds normal. No stridor. No respiratory distress. She has no wheezes. She has no rales.  Hoarse voice but no stridor.  Musculoskeletal: Normal range of motion.  Neurological: She is alert.  Skin: Skin is warm and dry. She is not diaphoretic.  Nursing note and vitals reviewed.    UC Treatments / Results  Labs (all labs ordered are listed, but only abnormal results are displayed) Labs Reviewed - No data to display  EKG None  Radiology No results found.  Procedures Procedures (including critical care time)  Medications Ordered in UC Medications - No data to display  Initial Impression / Assessment and Plan / UC Course  I have reviewed the triage vital signs and the nursing notes.  Pertinent labs & imaging results  that were available during my care of the patient were reviewed by me and considered in my medical decision making (see chart for details).     Hx and exam c/w viral illness Encouraged symptomatic treatment. Prescription to hold with expiration date for azithromycin. Pt to fill if persistent fever develops or not improving in 1 week.    Final Clinical Impressions(s) / UC Diagnoses   Final diagnoses:  Viral URI with cough  Laryngitis  Sinus congestion     Discharge Instructions      Your symptoms are likely due to a virus such as the common cold, however, if you developing  worsening chest congestion with shortness of breath, persistent fever for 3 days, or symptoms not improving in 4-5 days, you may fill the antibiotic (azithromycin).  If you do fill the antibiotic,  please take antibiotics as prescribed and be sure to complete entire course even if you start to feel better to ensure infection does not come back.  Please follow up with family medicine in 1 week as needed.     ED Prescriptions    Medication Sig Dispense Auth. Provider   ipratropium (ATROVENT) 0.06 % nasal spray Place 2 sprays into both nostrils 4 (four) times daily. 15 mL Tresa Jolley O, PA-C   benzonatate (TESSALON) 100 MG capsule Take 1-2 capsules (100-200 mg total) by mouth every 8 (eight) hours. 21 capsule Doroteo GlassmanPhelps, Ara Grandmaison O, PA-C   azithromycin (ZITHROMAX) 250 MG tablet Take 1 tablet (250 mg total) by mouth daily. Take first 2 tablets together, then 1 every day until finished. 6 tablet Lurene ShadowPhelps, Birt Reinoso O, PA-C     Controlled Substance Prescriptions [place] Controlled Substance Registry consulted? Not Applicable   Rolla Platehelps, Jameeka Marcy O, PA-C 01/10/18 1124

## 2018-01-10 NOTE — ED Triage Notes (Signed)
Started Friday with headache and congestion.  Cough yesterday.  Denies sore throat.  bilat ear fullness.  Productive cough at times. Green mucous.

## 2018-01-10 NOTE — Discharge Instructions (Signed)
°  Your symptoms are likely due to a virus such as the common cold, however, if you developing worsening chest congestion with shortness of breath, persistent fever for 3 days, or symptoms not improving in 4-5 days, you may fill the antibiotic (azithromycin).  If you do fill the antibiotic,  please take antibiotics as prescribed and be sure to complete entire course even if you start to feel better to ensure infection does not come back.  Please follow up with family medicine in 1 week as needed.

## 2018-04-03 ENCOUNTER — Ambulatory Visit: Payer: PRIVATE HEALTH INSURANCE | Admitting: Physician Assistant

## 2018-04-03 ENCOUNTER — Ambulatory Visit (INDEPENDENT_AMBULATORY_CARE_PROVIDER_SITE_OTHER): Payer: PRIVATE HEALTH INSURANCE | Admitting: Sports Medicine

## 2018-04-03 ENCOUNTER — Encounter: Payer: Self-pay | Admitting: Sports Medicine

## 2018-04-03 DIAGNOSIS — J0100 Acute maxillary sinusitis, unspecified: Secondary | ICD-10-CM | POA: Diagnosis not present

## 2018-04-03 DIAGNOSIS — R05 Cough: Secondary | ICD-10-CM

## 2018-04-03 MED ORDER — PREDNISONE 50 MG PO TABS
50.0000 mg | ORAL_TABLET | Freq: Every day | ORAL | 0 refills | Status: DC
Start: 2018-04-03 — End: 2018-04-12

## 2018-04-03 MED ORDER — HYDROCOD POLST-CPM POLST ER 10-8 MG/5ML PO SUER: 5.0000 mL | Freq: Two times a day (BID) | ORAL | 0 refills | Status: DC | PRN

## 2018-04-03 MED ORDER — AMOXICILLIN-POT CLAVULANATE 875-125 MG PO TABS: 1.0000 | ORAL_TABLET | Freq: Two times a day (BID) | ORAL | 0 refills | Status: DC

## 2018-04-03 NOTE — Progress Notes (Signed)
Subjective:    CC: Feeling sick  HPI: This is a pleasant 47 year old female, for the past week she has had increasing pain and pressure over her frontal and maxillary sinuses, mildly productive cough without hemoptysis, no fevers or chills, no muscle aches or body aches, no GI symptoms, no skin rash.  Symptoms are moderate, persistent.  I reviewed the past medical history, family history, social history, surgical history, and allergies today and no changes were needed.  Please see the problem list section below in epic for further details.  Past Medical History: Past Medical History:  Diagnosis Date  . Anxiety   . Hypertension   . Migraines   . Pneumonia    Past Surgical History: Past Surgical History:  Procedure Laterality Date  . CHOLECYSTECTOMY  2011   Social History: Social History   Socioeconomic History  . Marital status: Married    Spouse name: Not on file  . Number of children: Not on file  . Years of education: Not on file  . Highest education level: Not on file  Occupational History  . Not on file  Social Needs  . Financial resource strain: Not on file  . Food insecurity:    Worry: Not on file    Inability: Not on file  . Transportation needs:    Medical: Not on file    Non-medical: Not on file  Tobacco Use  . Smoking status: Never Smoker  . Smokeless tobacco: Never Used  Substance and Sexual Activity  . Alcohol use: Yes    Comment: occasional  . Drug use: No  . Sexual activity: Yes    Birth control/protection: None  Lifestyle  . Physical activity:    Days per week: Not on file    Minutes per session: Not on file  . Stress: Not on file  Relationships  . Social connections:    Talks on phone: Not on file    Gets together: Not on file    Attends religious service: Not on file    Active member of club or organization: Not on file    Attends meetings of clubs or organizations: Not on file    Relationship status: Not on file  Other Topics Concern    . Not on file  Social History Narrative  . Not on file   Family History: Family History  Problem Relation Age of Onset  . Hypertension Mother   . Hypertension Father   . Diabetes Father   . Cancer Father    Allergies: Allergies  Allergen Reactions  . Imitrex [Sumatriptan]     Nauseated.   Medications: See med rec.  Review of Systems: No fevers, chills, night sweats, weight loss, chest pain, or shortness of breath.   Objective:    General: Well Developed, well nourished, and in no acute distress.  Neuro: Alert and oriented x3, extra-ocular muscles intact, sensation grossly intact.  HEENT: Normocephalic, atraumatic, pupils equal round reactive to light, neck supple, no masses, no lymphadenopathy, thyroid nonpalpable.  Oropharynx, nasopharynx, ear canals unremarkable, maxillary sinuses tender to palpation. Skin: Warm and dry, no rashes. Cardiac: Regular rate and rhythm, no murmurs rubs or gallops, no lower extremity edema.  Respiratory: Clear to auscultation bilaterally. Not using accessory muscles, speaking in full sentences.  Impression and Recommendations:    Acute non-recurrent maxillary sinusitis With clear lungs. Augmentin, prednisone. Tussionex, she has some Tessalon Perles already. Out of work for the week.  ___________________________________________ Ihor Austinhomas J. Benjamin Stainhekkekandam, M.D., ABFM., CAQSM. Primary Care and Sports  Medicine [facility]  Adjunct Professor of Family Medicine  [employer]

## 2018-04-03 NOTE — Assessment & Plan Note (Signed)
With clear lungs. Augmentin, prednisone. Tussionex, she has some Tessalon Perles already. Out of work for the week.

## 2018-04-12 ENCOUNTER — Encounter: Payer: Self-pay | Admitting: Physician Assistant

## 2018-04-12 ENCOUNTER — Ambulatory Visit (INDEPENDENT_AMBULATORY_CARE_PROVIDER_SITE_OTHER): Payer: PRIVATE HEALTH INSURANCE

## 2018-04-12 ENCOUNTER — Ambulatory Visit (INDEPENDENT_AMBULATORY_CARE_PROVIDER_SITE_OTHER): Payer: PRIVATE HEALTH INSURANCE | Admitting: Physician Assistant

## 2018-04-12 VITALS — BP 105/70 | HR 71 | Temp 98.5°F | Wt 208.0 lb

## 2018-04-12 DIAGNOSIS — R05 Cough: Secondary | ICD-10-CM

## 2018-04-12 DIAGNOSIS — R0982 Postnasal drip: Secondary | ICD-10-CM

## 2018-04-12 DIAGNOSIS — R053 Chronic cough: Secondary | ICD-10-CM

## 2018-04-12 MED ORDER — IPRATROPIUM-ALBUTEROL 20-100 MCG/ACT IN AERS: 1.0000 | INHALATION_SPRAY | Freq: Four times a day (QID) | RESPIRATORY_TRACT | 5 refills | Status: DC | PRN

## 2018-04-12 MED ORDER — IPRATROPIUM BROMIDE 0.06 % NA SOLN: 2.0000 | Freq: Four times a day (QID) | NASAL | 0 refills | Status: DC | PRN

## 2018-04-12 MED ORDER — HYDROCODONE-HOMATROPINE 5-1.5 MG/5ML PO SYRP: 5.0000 mL | ORAL_SOLUTION | Freq: Four times a day (QID) | ORAL | 0 refills | Status: AC | PRN

## 2018-04-12 NOTE — Patient Instructions (Signed)
For nasal symptoms/sinusitis: - nasal saline rinses / netti pot several times per day (do this prior to nasal spray) - prescription Atrovent nasal spray: 2 sprays each nostril, up to 4 times per day as needed - you can use OTC nasal decongestant sprays like Afrin (oxymetazoline) BUT do not use for more than 3 days as it will cause worsening congestion/nasal symptoms) - warm facial compresses - oral decongestants and antihistamines like Claritin-D and Zyrtec-D may help dry up secretions (caution using decongestants if you have high blood pressure, heart disease or kidney disease) - for sinus headache: Tylenol 1000mg  every 8 hours as needed. Alternate with Ibuprofen 600mg  every 6 hours  For sore throat: - Tylenol 1000mg  every 8 hours as needed for throat pain. Alternate with Ibuprofen 600mg  every 6 hours - Cepacol throat lozenges and/or Chloraseptic spray - Warm salt water gargles  For cough: - Cough is a protective mechanism and an important part of fighting an infection. I encourage you to avoid suppressing your cough with medication during the day if possible.  - Mucinex with at least 8 oz. of water can loosen chest congestion and make cough more productive, which means you will actually cough less - Okay to use a cough suppressant at bedtime in order to rest  Note: follow package instructions for all over-the-counter medications. If using multi-symptom medications (Dayquil, Theraflu, etc.), check the label for duplicate drug ingredients.

## 2018-04-12 NOTE — Progress Notes (Signed)
HPI:                                                                Darlene Melendez is a 47 y.o. female who presents to [facility] today for cough and congestion  Cough  This is a new problem. The current episode started 1 to 4 weeks ago (x 3 weeks). The problem has been unchanged. Associated symptoms include a fever, postnasal drip, rhinorrhea (clear) and a sore throat (waxing and waning). Pertinent negatives include no chest pain, hemoptysis, shortness of breath or wheezing. Nothing aggravates the symptoms. She has tried prescription cough suppressant, oral steroids and OTC cough suppressant for the symptoms. Her past medical history is significant for pneumonia (2016).   Treated aggressively on 04/03/18 by my colleague, Dr. Benjamin Stain, with Augmentin, Prednisone and Tussionex.    Past Medical History:  Diagnosis Date  . Anxiety   . Hypertension   . Migraines   . Pneumonia    Past Surgical History:  Procedure Laterality Date  . CHOLECYSTECTOMY  2011   Social History   Tobacco Use  . Smoking status: Never Smoker  . Smokeless tobacco: Never Used  Substance Use Topics  . Alcohol use: Yes    Comment: occasional   family history includes Cancer in her father; Diabetes in her father; Hypertension in her father and mother.    ROS: negative except as noted in the HPI  Medications: Current Outpatient Medications  Medication Sig Dispense Refill  . citalopram (CELEXA) 20 MG tablet Take 20 mg by mouth daily.    . enalapril-hydrochlorothiazide (VASERETIC) 10-25 MG tablet TAKE 1 TABLET BY MOUTH EVERY DAY 90 tablet 4  . ibuprofen (ADVIL,MOTRIN) 800 MG tablet Take 1 tablet (800 mg total) by mouth every 8 (eight) hours as needed. 60 tablet 2   No current facility-administered medications for this visit.    Allergies  Allergen Reactions  . Imitrex [Sumatriptan]     Nauseated.       Objective:  BP 105/70   Pulse 71    Temp 98.5 F (36.9 C) (Oral)   Wt 208 lb (94.3 kg)   SpO2 99%   BMI 33.57 kg/m  Gen:  alert, not ill-appearing, no distress, appropriate for age HEENT: head normocephalic without obvious abnormality, conjunctiva and cornea clear, tympanic membranes pearly gray and semitransparent bilaterally, normal external ear canals bilaterally, no frontal or maxillary sinus tenderness, oropharynx clear, no exudates, uvula midline, tender tonsillar adenopathy, neck supple trachea midline Pulm: Normal work of breathing, voice is hoarse, diminished expiratory breath sounds, no wheezes, rales, rhonchi appreciated CV: Normal rate, regular rhythm, s1 and s2 distinct, no murmurs, clicks or rubs  Neuro: alert and oriented x 3, no tremor MSK: extremities atraumatic, normal gait and station Skin: intact, no rashes on exposed skin, no jaundice, no cyanosis   No results found for this or any previous visit (from the past 72 hour(s)). No results found.    Assessment and Plan: 47 y.o. female with   .[name] was seen today for cough.  Diagnoses and all orders for this visit:  Persistent cough for 3 weeks or longer -     DG Chest 2 View -     Ipratropium-Albuterol (COMBIVENT) 20-100 MCG/ACT AERS  respimat; Inhale 1 puff into the lungs 4 (four) times daily as needed for wheezing or shortness of breath (bronchospasm). -     HYDROcodone-homatropine (HYCODAN) 5-1.5 MG/5ML syrup; Take 5 mLs by mouth every 6 (six) hours as needed for up to 5 days for cough.  Postnasal discharge -     ipratropium (ATROVENT) 0.06 % nasal spray; Place 2 sprays into both nostrils 4 (four) times daily as needed for rhinitis.   Afebrile, no tachypnea, no tachycardia, pulse ox 99% on RA at rest, lung sounds reduced,  diminished expiratory breath sounds, no wheezes, rales, rhonchi appreciated CXR to assess for infiltrate, but I suspect cough may be coming from post-nasal discharge Did not respond to single round of antibiotics and  prednisone burst. Nasal secretions are clear Aggressive nasal toileting with saline and Atrovent Combivent for bronchospasm Did not find Tussionex helpful, will try Hycodan Counseled on supportive care IF symptoms do not improve in the next 3 days, consider 2nd course of antibiotics  Patient education and anticipatory guidance given Patient agrees with treatment plan Follow-up as needed if symptoms worsen or fail to improve  Levonne Hubertharley E. Ramiah Helfrich PA-C

## 2018-06-05 ENCOUNTER — Ambulatory Visit (INDEPENDENT_AMBULATORY_CARE_PROVIDER_SITE_OTHER): Payer: PRIVATE HEALTH INSURANCE | Admitting: Physician Assistant

## 2018-06-05 ENCOUNTER — Encounter: Payer: Self-pay | Admitting: Physician Assistant

## 2018-06-05 VITALS — BP 114/62 | HR 85 | Temp 100.4°F | Wt 208.0 lb

## 2018-06-05 DIAGNOSIS — R509 Fever, unspecified: Secondary | ICD-10-CM | POA: Diagnosis not present

## 2018-06-05 DIAGNOSIS — J181 Lobar pneumonia, unspecified organism: Secondary | ICD-10-CM

## 2018-06-05 DIAGNOSIS — J189 Pneumonia, unspecified organism: Secondary | ICD-10-CM

## 2018-06-05 LAB — POCT INFLUENZA A/B
Influenza A, POC: NEGATIVE
Influenza B, POC: NEGATIVE

## 2018-06-05 MED ORDER — HYDROCOD POLST-CPM POLST ER 10-8 MG/5ML PO SUER: 5.0000 mL | Freq: Two times a day (BID) | ORAL | 0 refills | Status: DC | PRN

## 2018-06-05 MED ORDER — BENZONATATE 200 MG PO CAPS: 200.0000 mg | ORAL_CAPSULE | Freq: Two times a day (BID) | ORAL | 0 refills | Status: DC | PRN

## 2018-06-05 MED ORDER — ALBUTEROL SULFATE HFA 108 (90 BASE) MCG/ACT IN AERS
2.0000 | INHALATION_SPRAY | Freq: Four times a day (QID) | RESPIRATORY_TRACT | 0 refills | Status: DC | PRN
Start: 2018-06-05 — End: 2020-06-20

## 2018-06-05 MED ORDER — AZITHROMYCIN 250 MG PO TABS: ORAL_TABLET | ORAL | 0 refills | Status: DC

## 2018-06-05 NOTE — Progress Notes (Signed)
Subjective:    Patient ID: Darlene RussellMelanie R Melendez, female    DOB: 1970/11/26, 48 y.o.   MRN: [id number]  HPI  Patient is a 48 year old female who presents to the clinic with cough, shortness of breath, fever.  She started feeling bad about 8 days ago.  Is gradually worsened throughout the week.  This weekend she has done nothing but lay around.  Her fever is around 100 without any Tylenol or ibuprofen.  Every time she coughs her chest hurts.  She is unable to sleep due to the cough.  She does not have a history of asthma however she does have Combivent that she has taken during upper respiratory infections that she has had in the past.  She does feel a little short of breath today.  .. Active Ambulatory Problems    Diagnosis Date Noted  . Migraine without aura 03/31/2007  . ESSENTIAL HYPERTENSION, BENIGN 03/31/2007  . GALLSTONES 08/13/2009  . NECK STIFFNESS 06/09/2007  . ROTATOR CUFF SYNDROME, RIGHT 05/27/2008  . Weight gain 03/31/2007  . Obesity 08/13/2014  . Hyperlipidemia 08/16/2014  . Skin macule 01/01/2015  . CAP (community acquired pneumonia) 09/01/2015  . Hypokalemia 09/01/2015  . Cephalalgia 09/01/2015  . Elevated liver enzymes 09/02/2015  . Skin lesion 03/22/2016  . Knee crepitus 03/22/2016  . Primary osteoarthritis of knees, bilateral 03/22/2016  . Hypertriglyceridemia 04/07/2016  . Obesity (BMI 35.0-39.9 without comorbidity) 10/12/2016  . Influenza-like illness 05/30/2017  . Accessory skin tags 09/07/2017   Resolved Ambulatory Problems    Diagnosis Date Noted  . Acute sinusitis, unspecified 04/04/2009  . RUQ PAIN 08/06/2009  . VIRAL URI 05/06/2010  . Acute non-recurrent maxillary sinusitis 04/03/2018   Past Medical History:  Diagnosis Date  . Anxiety   . Hypertension   . Migraines   . Pneumonia      Review of Systems  All other systems reviewed and are negative.      Objective:   Physical Exam Vitals signs reviewed.  Constitutional:      Appearance:  Normal appearance.  HENT:     Head: Normocephalic and atraumatic.     Right Ear: Tympanic membrane normal.     Left Ear: Tympanic membrane normal.     Nose: Congestion present.     Mouth/Throat:     Pharynx: Oropharynx is clear. Posterior oropharyngeal erythema present.  Eyes:     Conjunctiva/sclera: Conjunctivae normal.  Cardiovascular:     Rate and Rhythm: Normal rate and regular rhythm.     Pulses: Normal pulses.     Heart sounds: Normal heart sounds.  Pulmonary:     Comments: Right lower lung crackles and rhonchi.  Skin:    General: Skin is warm.  Neurological:     General: No focal deficit present.     Mental Status: She is alert and oriented to person, place, and time.  Psychiatric:        Mood and Affect: Mood normal.           Assessment & Plan:  Marland Kitchen.Marland Kitchen.Darlene Melendez was seen today for cough.  Diagnoses and all orders for this visit:  Community acquired pneumonia of right lower lobe of lung (HCC) -     albuterol (PROVENTIL HFA;VENTOLIN HFA) 108 (90 Base) MCG/ACT inhaler; Inhale 2 puffs into the lungs every 6 (six) hours as needed for wheezing or shortness of breath. -     azithromycin (ZITHROMAX Z-PAK) 250 MG tablet; Take 2 tablets (500 mg) on  Day 1,  followed by 1  tablet (250 mg) once daily on Days 2 through 5. -     chlorpheniramine-HYDROcodone (TUSSIONEX) 10-8 MG/5ML SUER; Take 5 mLs by mouth every 12 (twelve) hours as needed. -     benzonatate (TESSALON) 200 MG capsule; Take 1 capsule (200 mg total) by mouth 2 (two) times daily as needed for cough.  Fever, unspecified fever cause -     albuterol (PROVENTIL HFA;VENTOLIN HFA) 108 (90 Base) MCG/ACT inhaler; Inhale 2 puffs into the lungs every 6 (six) hours as needed for wheezing or shortness of breath. -     POCT Influenza A/B  .Marland Kitchen Results for orders placed or performed in visit on 06/05/18  POCT Influenza A/B  Result Value Ref Range   Influenza A, POC Negative Negative   Influenza B, POC Negative Negative   Negative  flu test.   Right lower lung sounds consistently pneumonia.  Treated with Z-Pak empirically.  Use tessalon pearls during the day.  tussionex at syrup at night. Small quanity given.  Use combivent prn q 4 hrs for cough/SOB.  Follow up as needed.  Marland KitchenMarland KitchenPDMP not reviewed this encounter.  No concerns.

## 2018-06-05 NOTE — Progress Notes (Signed)
Please let patient know negative

## 2018-06-05 NOTE — Patient Instructions (Signed)
Community-Acquired Pneumonia, Adult  Pneumonia is an infection of the lungs. It causes swelling in the airways of the lungs. Mucus and fluid may also build up inside the airways.  One type of pneumonia can happen while a person is in a hospital. A different type can happen when a person is not in a hospital (community-acquired pneumonia).   What are the causes?    This condition is caused by germs (viruses, bacteria, or fungi). Some types of germs can be passed from one person to another. This can happen when you breathe in droplets from the cough or sneeze of an infected person.  What increases the risk?  You are more likely to develop this condition if you:   Have a long-term (chronic) disease, such as:  ? Chronic obstructive pulmonary disease (COPD).  ? Asthma.  ? Cystic fibrosis.  ? Congestive heart failure.  ? Diabetes.  ? Kidney disease.   Have HIV.   Have sickle cell disease.   Have had your spleen removed.   Do not take good care of your teeth and mouth (poor dental hygiene).   Have a medical condition that increases the risk of breathing in droplets from your own mouth and nose.   Have a weakened body defense system (immune system).   Are a smoker.   Travel to areas where the germs that cause this illness are common.   Are around certain animals or the places they live.  What are the signs or symptoms?   A dry cough.   A wet (productive) cough.   Fever.   Sweating.   Chest pain. This often happens when breathing deeply or coughing.   Fast breathing or trouble breathing.   Shortness of breath.   Shaking chills.   Feeling tired (fatigue).   Muscle aches.  How is this treated?  Treatment for this condition depends on many things. Most adults can be treated at home. In some cases, treatment must happen in a hospital. Treatment may include:   Medicines given by mouth or through an IV tube.   Being given extra oxygen.   Respiratory therapy.  In rare cases, treatment for very bad pneumonia  may include:   Using a machine to help you breathe.   Having a procedure to remove fluid from around your lungs.  Follow these instructions at home:  Medicines   Take over-the-counter and prescription medicines only as told by your doctor.  ? Only take cough medicine if you are losing sleep.   If you were prescribed an antibiotic medicine, take it as told by your doctor. Do not stop taking the antibiotic even if you start to feel better.  General instructions     Sleep with your head and neck raised (elevated). You can do this by sleeping in a recliner or by putting a few pillows under your head.   Rest as needed. Get at least 8 hours of sleep each night.   Drink enough water to keep your pee (urine) pale yellow.   Eat a healthy diet that includes plenty of vegetables, fruits, whole grains, low-fat dairy products, and lean protein.   Do not use any products that contain nicotine or tobacco. These include cigarettes, e-cigarettes, and chewing tobacco. If you need help quitting, ask your doctor.   Keep all follow-up visits as told by your doctor. This is important.  How is this prevented?  A shot (vaccine) can help prevent pneumonia. Shots are often suggested for:   People   older than 50 years of age.   People older than 50 years of age who:  ? Are having cancer treatment.  ? Have long-term (chronic) lung disease.  ? Have problems with their body's defense system.  You may also prevent pneumonia if you take these actions:   Get the flu (influenza) shot every year.   Go to the dentist as often as told.   Wash your hands often. If you cannot use soap and water, use hand sanitizer.  Contact a doctor if:   You have a fever.   You lose sleep because your cough medicine does not help.  Get help right away if:   You are short of breath and it gets worse.   You have more chest pain.   Your sickness gets worse. This is very serious if:  ? You are an older adult.  ? Your body's defense system is weak.   You  cough up blood.  Summary   Pneumonia is an infection of the lungs.   Most adults can be treated at home. Some will need treatment in a hospital.   Drink enough water to keep your pee pale yellow.   Get at least 8 hours of sleep each night.  This information is not intended to replace advice given to you by your health care provider. Make sure you discuss any questions you have with your health care provider.  Document Released: 09/29/2007 Document Revised: 12/08/2017 Document Reviewed: 12/08/2017  Elsevier Interactive Patient Education  2019 [employer]

## 2018-07-29 IMAGING — DX DG CHEST 2V
2 series · 2 of 2 positions shown · non-contrast
Comparison: September 17, 2015

CLINICAL DATA: Cough for 2 weeks

EXAM:
CHEST  2 VIEW

[chest pa]
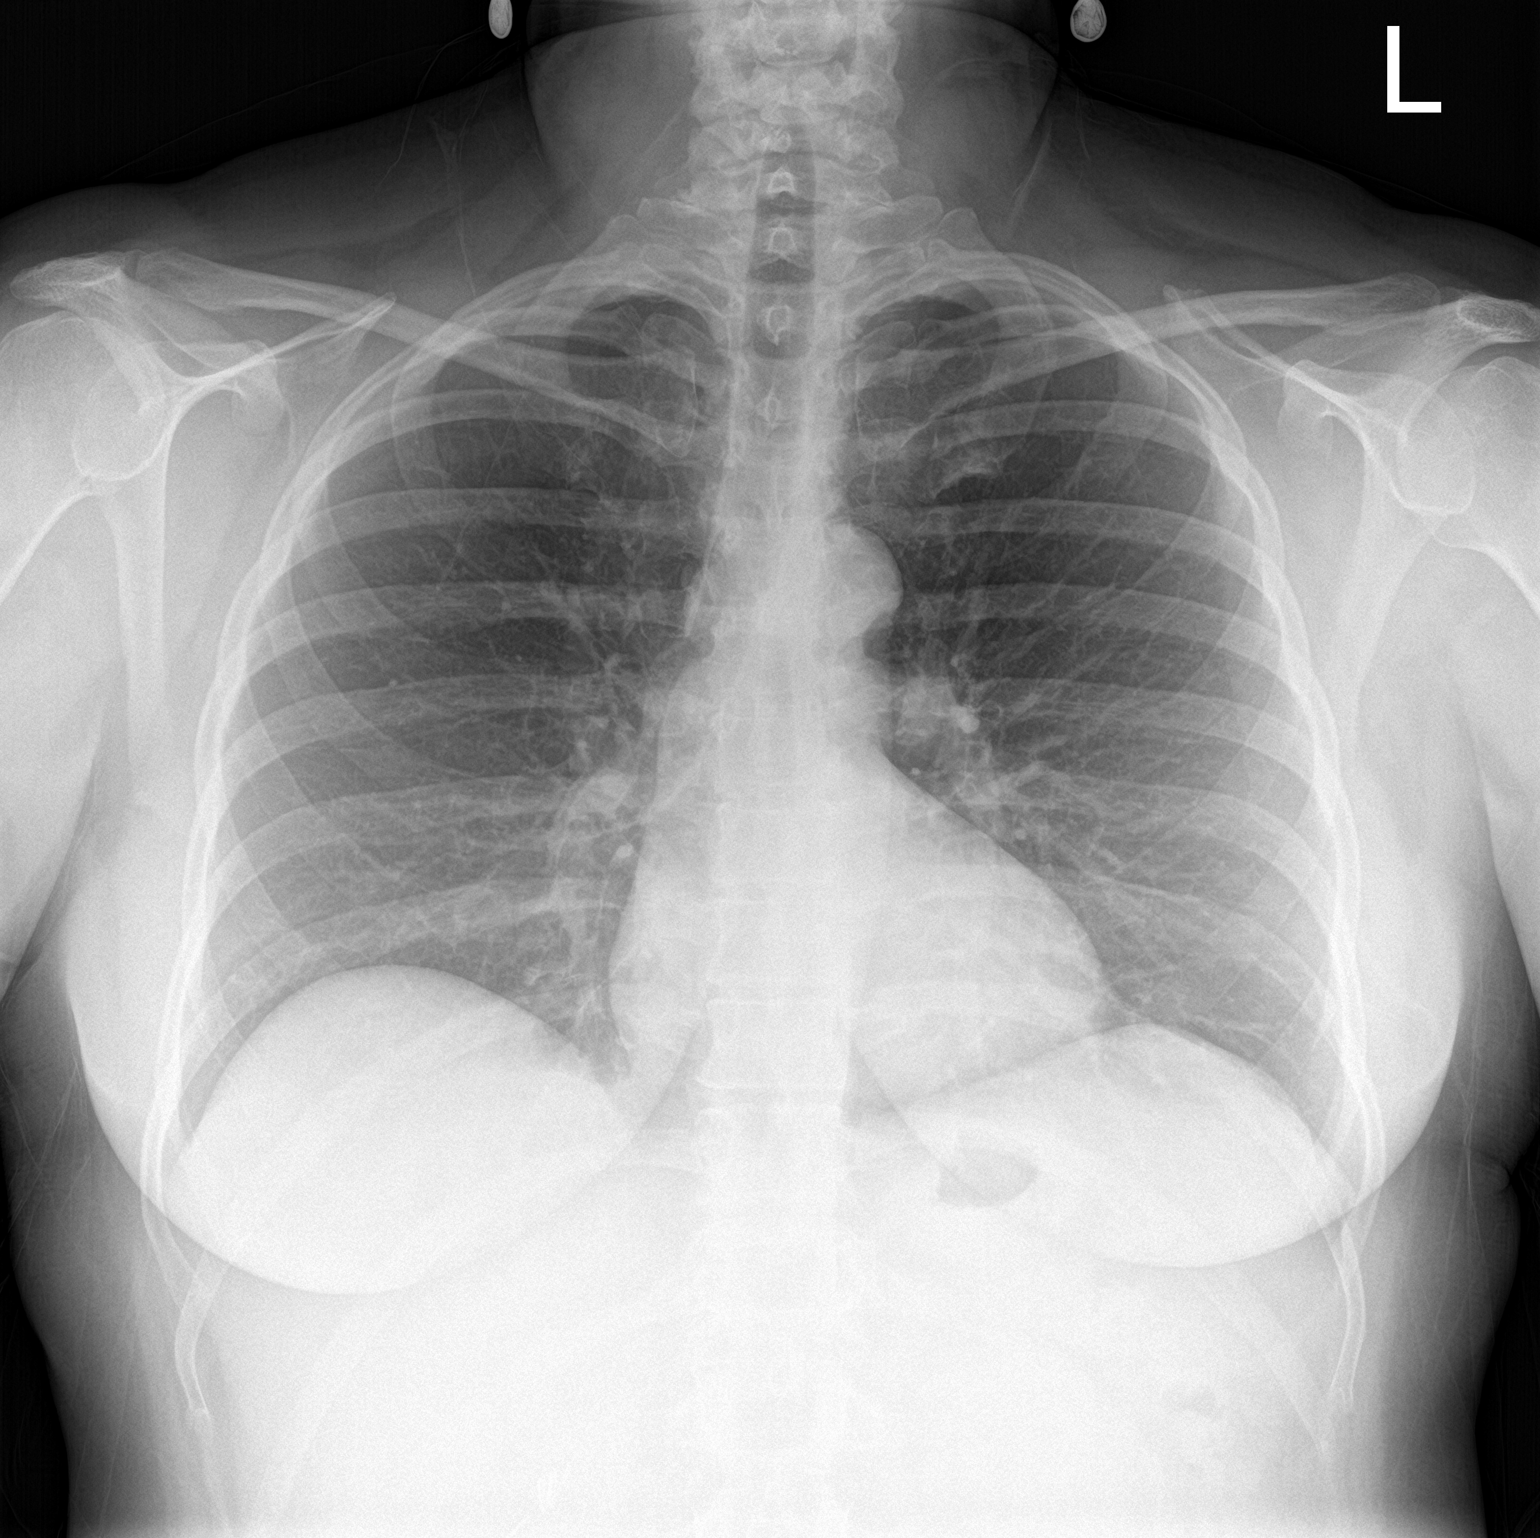

[chest lat]
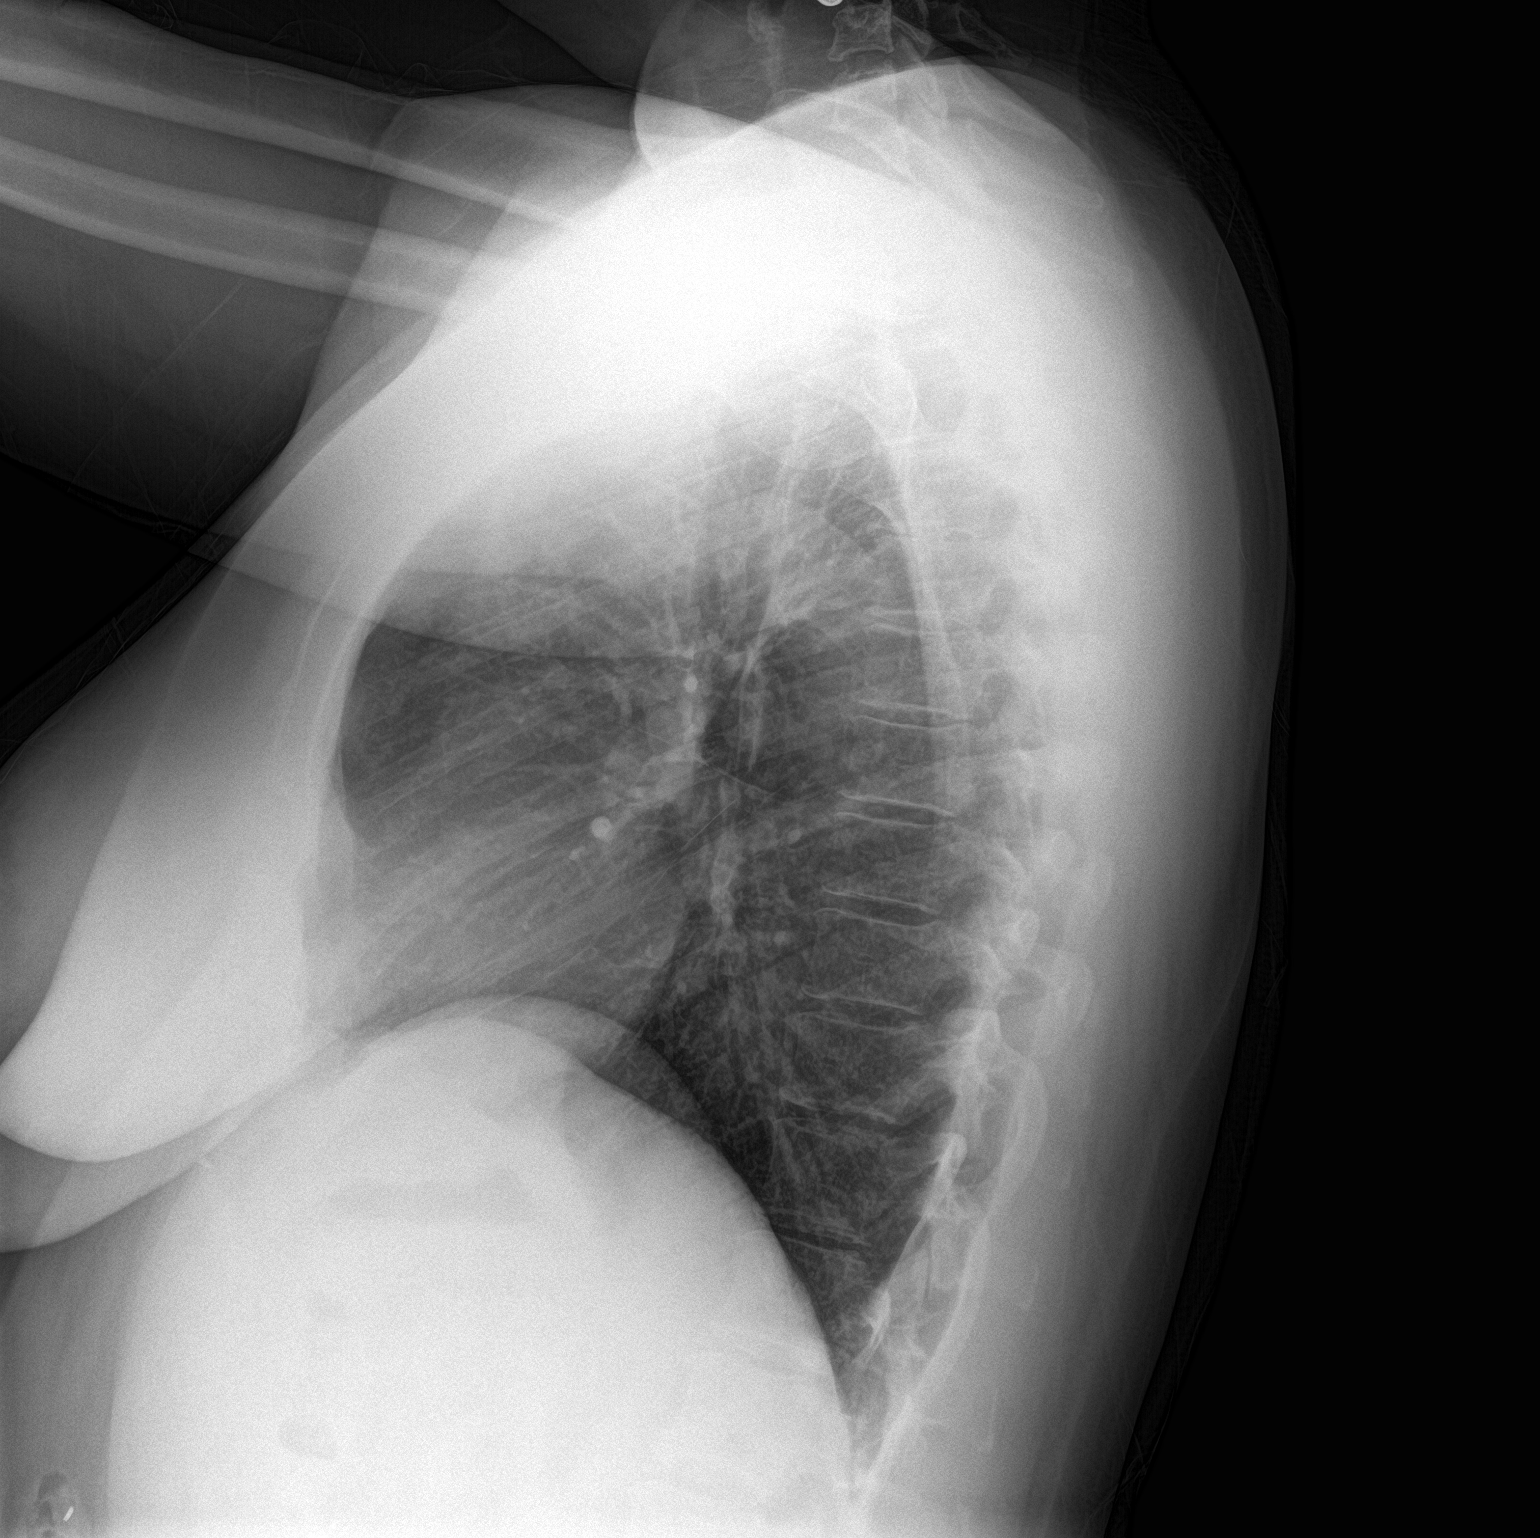

[2 of 2 positions shown; findings below may reference images not displayed]

FINDINGS: There is minimal scarring in the left base. There is no edema or
consolidation. The heart size and pulmonary vascularity are normal.
No adenopathy. No bone lesions.
IMPRESSION: Minimal scarring left base.  No edema or consolidation.

## 2018-10-12 LAB — HM MAMMOGRAPHY

## 2018-11-30 ENCOUNTER — Other Ambulatory Visit: Payer: Self-pay | Admitting: Physician Assistant

## 2018-11-30 DIAGNOSIS — I10 Essential (primary) hypertension: Secondary | ICD-10-CM

## 2018-12-15 LAB — HM COLONOSCOPY

## 2018-12-26 ENCOUNTER — Other Ambulatory Visit: Payer: Self-pay | Admitting: Neurology

## 2018-12-26 DIAGNOSIS — I10 Essential (primary) hypertension: Secondary | ICD-10-CM

## 2018-12-26 MED ORDER — ENALAPRIL-HYDROCHLOROTHIAZIDE 10-25 MG PO TABS: ORAL_TABLET | ORAL | 0 refills | Status: DC

## 2019-01-09 ENCOUNTER — Other Ambulatory Visit: Payer: Self-pay | Admitting: Physician Assistant

## 2019-01-09 DIAGNOSIS — I10 Essential (primary) hypertension: Secondary | ICD-10-CM

## 2019-01-18 ENCOUNTER — Other Ambulatory Visit: Payer: Self-pay

## 2019-01-18 DIAGNOSIS — Z20822 Contact with and (suspected) exposure to covid-19: Secondary | ICD-10-CM

## 2019-01-19 LAB — NOVEL CORONAVIRUS, NAA: SARS-CoV-2, NAA: NOT DETECTED

## 2019-01-25 ENCOUNTER — Other Ambulatory Visit: Payer: Self-pay | Admitting: Emergency Medicine

## 2019-01-25 DIAGNOSIS — Z20822 Contact with and (suspected) exposure to covid-19: Secondary | ICD-10-CM

## 2019-01-26 LAB — NOVEL CORONAVIRUS, NAA: SARS-CoV-2, NAA: NOT DETECTED

## 2019-01-30 ENCOUNTER — Other Ambulatory Visit: Payer: Self-pay | Admitting: Physician Assistant

## 2019-01-30 ENCOUNTER — Telehealth: Payer: Self-pay | Admitting: Physician Assistant

## 2019-01-30 DIAGNOSIS — I10 Essential (primary) hypertension: Secondary | ICD-10-CM

## 2019-01-30 MED ORDER — ENALAPRIL-HYDROCHLOROTHIAZIDE 10-25 MG PO TABS: 1.0000 | ORAL_TABLET | Freq: Every day | ORAL | 0 refills | Status: DC

## 2019-01-30 NOTE — Telephone Encounter (Signed)
Pt has scheduled her appointment  with Luvenia Starch for refill on med for 10/16.  She is going to need refill on her Enalapril HCTZ to last her until then.  Thanks.

## 2019-01-30 NOTE — Telephone Encounter (Signed)
10 day supply sent, left pt msg advising RX sent

## 2019-02-09 ENCOUNTER — Ambulatory Visit (INDEPENDENT_AMBULATORY_CARE_PROVIDER_SITE_OTHER): Payer: PRIVATE HEALTH INSURANCE | Admitting: Physician Assistant

## 2019-02-09 ENCOUNTER — Encounter: Payer: Self-pay | Admitting: Physician Assistant

## 2019-02-09 ENCOUNTER — Other Ambulatory Visit: Payer: Self-pay

## 2019-02-09 VITALS — BP 138/63 | HR 77 | Ht 66.0 in | Wt 216.0 lb

## 2019-02-09 DIAGNOSIS — E781 Pure hyperglyceridemia: Secondary | ICD-10-CM

## 2019-02-09 DIAGNOSIS — M25561 Pain in right knee: Secondary | ICD-10-CM

## 2019-02-09 DIAGNOSIS — I10 Essential (primary) hypertension: Secondary | ICD-10-CM | POA: Diagnosis not present

## 2019-02-09 DIAGNOSIS — Z13220 Encounter for screening for lipoid disorders: Secondary | ICD-10-CM | POA: Diagnosis not present

## 2019-02-09 DIAGNOSIS — Z6830 Body mass index (BMI) 30.0-30.9, adult: Secondary | ICD-10-CM

## 2019-02-09 DIAGNOSIS — M170 Bilateral primary osteoarthritis of knee: Secondary | ICD-10-CM

## 2019-02-09 DIAGNOSIS — E6609 Other obesity due to excess calories: Secondary | ICD-10-CM

## 2019-02-09 DIAGNOSIS — R7301 Impaired fasting glucose: Secondary | ICD-10-CM

## 2019-02-09 DIAGNOSIS — Z131 Encounter for screening for diabetes mellitus: Secondary | ICD-10-CM | POA: Diagnosis not present

## 2019-02-09 MED ORDER — ENALAPRIL-HYDROCHLOROTHIAZIDE 10-25 MG PO TABS: 1.0000 | ORAL_TABLET | Freq: Every day | ORAL | 1 refills | Status: DC

## 2019-02-09 MED ORDER — DICLOFENAC SODIUM 1 % TD GEL: 4.0000 g | Freq: Four times a day (QID) | TRANSDERMAL | 1 refills | Status: DC

## 2019-02-09 NOTE — Patient Instructions (Signed)
Journal for Nurse Practitioners, 15(4), 263-267. Retrieved January 30, 2018 from http://clinicalkey.com/nursing">  Knee Exercises Ask your health care provider which exercises are safe for you. Do exercises exactly as told by your health care provider and adjust them as directed. It is normal to feel mild stretching, pulling, tightness, or discomfort as you do these exercises. Stop right away if you feel sudden pain or your pain gets worse. Do not begin these exercises until told by your health care provider. Stretching and range-of-motion exercises These exercises warm up your muscles and joints and improve the movement and flexibility of your knee. These exercises also help to relieve pain and swelling. Knee extension, prone 1. Lie on your abdomen (prone position) on a bed. 2. Place your left / right knee just beyond the edge of the surface so your knee is not on the bed. You can put a towel under your left / right thigh just above your kneecap for comfort. 3. Relax your leg muscles and allow gravity to straighten your knee (extension). You should feel a stretch behind your left / right knee. 4. Hold this position for __________ seconds. 5. Scoot up so your knee is supported between repetitions. Repeat __________ times. Complete this exercise __________ times a day. Knee flexion, active  1. Lie on your back with both legs straight. If this causes back discomfort, bend your left / right knee so your foot is flat on the floor. 2. Slowly slide your left / right heel back toward your buttocks. Stop when you feel a gentle stretch in the front of your knee or thigh (flexion). 3. Hold this position for __________ seconds. 4. Slowly slide your left / right heel back to the starting position. Repeat __________ times. Complete this exercise __________ times a day. Quadriceps stretch, prone  1. Lie on your abdomen on a firm surface, such as a bed or padded floor. 2. Bend your left / right knee and hold  your ankle. If you cannot reach your ankle or pant leg, loop a belt around your foot and grab the belt instead. 3. Gently pull your heel toward your buttocks. Your knee should not slide out to the side. You should feel a stretch in the front of your thigh and knee (quadriceps). 4. Hold this position for __________ seconds. Repeat __________ times. Complete this exercise __________ times a day. Hamstring, supine 1. Lie on your back (supine position). 2. Loop a belt or towel over the ball of your left / right foot. The ball of your foot is on the walking surface, right under your toes. 3. Straighten your left / right knee and slowly pull on the belt to raise your leg until you feel a gentle stretch behind your knee (hamstring). ? Do not let your knee bend while you do this. ? Keep your other leg flat on the floor. 4. Hold this position for __________ seconds. Repeat __________ times. Complete this exercise __________ times a day. Strengthening exercises These exercises build strength and endurance in your knee. Endurance is the ability to use your muscles for a long time, even after they get tired. Quadriceps, isometric This exercise stretches the muscles in front of your thigh (quadriceps) without moving your knee joint (isometric). 1. Lie on your back with your left / right leg extended and your other knee bent. Put a rolled towel or small pillow under your knee if told by your health care provider. 2. Slowly tense the muscles in the front of your left /   right thigh. You should see your kneecap slide up toward your hip or see increased dimpling just above the knee. This motion will push the back of the knee toward the floor. 3. For __________ seconds, hold the muscle as tight as you can without increasing your pain. 4. Relax the muscles slowly and completely. Repeat __________ times. Complete this exercise __________ times a day. Straight leg raises This exercise stretches the muscles in front  of your thigh (quadriceps) and the muscles that move your hips (hip flexors). 1. Lie on your back with your left / right leg extended and your other knee bent. 2. Tense the muscles in the front of your left / right thigh. You should see your kneecap slide up or see increased dimpling just above the knee. Your thigh may even shake a bit. 3. Keep these muscles tight as you raise your leg 4-6 inches (10-15 cm) off the floor. Do not let your knee bend. 4. Hold this position for __________ seconds. 5. Keep these muscles tense as you lower your leg. 6. Relax your muscles slowly and completely after each repetition. Repeat __________ times. Complete this exercise __________ times a day. Hamstring, isometric 1. Lie on your back on a firm surface. 2. Bend your left / right knee about __________ degrees. 3. Dig your left / right heel into the surface as if you are trying to pull it toward your buttocks. Tighten the muscles in the back of your thighs (hamstring) to "dig" as hard as you can without increasing any pain. 4. Hold this position for __________ seconds. 5. Release the tension gradually and allow your muscles to relax completely for __________ seconds after each repetition. Repeat __________ times. Complete this exercise __________ times a day. Hamstring curls If told by your health care provider, do this exercise while wearing ankle weights. Begin with __________ lb weights. Then increase the weight by 1 lb (0.5 kg) increments. Do not wear ankle weights that are more than __________ lb. 1. Lie on your abdomen with your legs straight. 2. Bend your left / right knee as far as you can without feeling pain. Keep your hips flat against the floor. 3. Hold this position for __________ seconds. 4. Slowly lower your leg to the starting position. Repeat __________ times. Complete this exercise __________ times a day. Squats This exercise strengthens the muscles in front of your thigh and knee  (quadriceps). 1. Stand in front of a table, with your feet and knees pointing straight ahead. You may rest your hands on the table for balance but not for support. 2. Slowly bend your knees and lower your hips like you are going to sit in a chair. ? Keep your weight over your heels, not over your toes. ? Keep your lower legs upright so they are parallel with the table legs. ? Do not let your hips go lower than your knees. ? Do not bend lower than told by your health care provider. ? If your knee pain increases, do not bend as low. 3. Hold the squat position for __________ seconds. 4. Slowly push with your legs to return to standing. Do not use your hands to pull yourself to standing. Repeat __________ times. Complete this exercise __________ times a day. Wall slides This exercise strengthens the muscles in front of your thigh and knee (quadriceps). 1. Lean your back against a smooth wall or door, and walk your feet out 18-24 inches (46-61 cm) from it. 2. Place your feet hip-width apart. 3.   Slowly slide down the wall or door until your knees bend __________ degrees. Keep your knees over your heels, not over your toes. Keep your knees in line with your hips. 4. Hold this position for __________ seconds. Repeat __________ times. Complete this exercise __________ times a day. Straight leg raises This exercise strengthens the muscles that rotate the leg at the hip and move it away from your body (hip abductors). 1. Lie on your side with your left / right leg in the top position. Lie so your head, shoulder, knee, and hip line up. You may bend your bottom knee to help you keep your balance. 2. Roll your hips slightly forward so your hips are stacked directly over each other and your left / right knee is facing forward. 3. Leading with your heel, lift your top leg 4-6 inches (10-15 cm). You should feel the muscles in your outer hip lifting. ? Do not let your foot drift forward. ? Do not let your knee  roll toward the ceiling. 4. Hold this position for __________ seconds. 5. Slowly return your leg to the starting position. 6. Let your muscles relax completely after each repetition. Repeat __________ times. Complete this exercise __________ times a day. Straight leg raises This exercise stretches the muscles that move your hips away from the front of the pelvis (hip extensors). 1. Lie on your abdomen on a firm surface. You can put a pillow under your hips if that is more comfortable. 2. Tense the muscles in your buttocks and lift your left / right leg about 4-6 inches (10-15 cm). Keep your knee straight as you lift your leg. 3. Hold this position for __________ seconds. 4. Slowly lower your leg to the starting position. 5. Let your leg relax completely after each repetition. Repeat __________ times. Complete this exercise __________ times a day. This information is not intended to replace advice given to you by your health care provider. Make sure you discuss any questions you have with your health care provider. Document Released: 02/24/2005 Document Revised: 01/31/2018 Document Reviewed: 01/31/2018 Elsevier Patient Education  2020 [employer]  

## 2019-02-09 NOTE — Progress Notes (Signed)
Subjective:    Patient ID: Darlene Melendez, female    DOB: [birth year], 48 y.o.   MRN: [id number]  HPI  Pt is a 48 yo female with HTN who presents to the clinic for 6 month follow up.   She is doing well. No CP, palpitations, headaches or vision changes.   She does report some right medial knee pain. She has OA in both knees. She remembers twisting it the wrong way a few weeks ago. Wore a brace for a few days and helped. Not done anything else to make better. Seems to ache more.   .. Active Ambulatory Problems    Diagnosis Date Noted  . Migraine without aura 03/31/2007  . ESSENTIAL HYPERTENSION, BENIGN 03/31/2007  . GALLSTONES 08/13/2009  . NECK STIFFNESS 06/09/2007  . ROTATOR CUFF SYNDROME, RIGHT 05/27/2008  . Weight gain 03/31/2007  . Obesity 08/13/2014  . Hyperlipidemia 08/16/2014  . Skin macule 01/01/2015  . CAP (community acquired pneumonia) 09/01/2015  . Hypokalemia 09/01/2015  . Cephalalgia 09/01/2015  . Elevated liver enzymes 09/02/2015  . Skin lesion 03/22/2016  . Knee crepitus 03/22/2016  . Primary osteoarthritis of knees, bilateral 03/22/2016  . Hypertriglyceridemia 04/07/2016  . Obesity (BMI 35.0-39.9 without comorbidity) 10/12/2016  . Influenza-like illness 05/30/2017  . Accessory skin tags 09/07/2017   Resolved Ambulatory Problems    Diagnosis Date Noted  . Acute sinusitis, unspecified 04/04/2009  . RUQ PAIN 08/06/2009  . VIRAL URI 05/06/2010  . Acute non-recurrent maxillary sinusitis 04/03/2018   Past Medical History:  Diagnosis Date  . Anxiety   . Hypertension   . Migraines   . Pneumonia        Review of Systems  All other systems reviewed and are negative.      Objective:   Physical Exam Vitals signs reviewed.  Constitutional:      Appearance: Normal appearance. She is obese.  Cardiovascular:     Rate and Rhythm: Normal rate and regular rhythm.     Pulses: Normal pulses.  Pulmonary:     Effort: Pulmonary effort is normal.   Breath sounds: Normal breath sounds.  Musculoskeletal:     Comments: Tenderness over the medial epicondyle of the knee to palpation. No tenderness in joint space.  NROM of right knee.  No swelling.  Negative mcmurrays.  Negative anterior drawer.   Neurological:     General: No focal deficit present.     Mental Status: She is alert and oriented to person, place, and time.  Psychiatric:        Mood and Affect: Mood normal.          .. Depression screen [facility] 2/9 02/09/2019 09/07/2017  Decreased Interest 0 0  Down, Depressed, Hopeless 0 0  PHQ - 2 Score 0 0  Altered sleeping 0 -  Tired, decreased energy 1 -  Change in appetite 0 -  Feeling bad or failure about yourself  0 -  Trouble concentrating 0 -  Moving slowly or fidgety/restless 0 -  Suicidal thoughts 0 -  PHQ-9 Score 1 -  Difficult doing work/chores Not difficult at all -   .. GAD 7 : Generalized Anxiety Score 02/09/2019  Nervous, Anxious, on Edge 1  Control/stop worrying 0  Worry too much - different things 0  Trouble relaxing 0  Restless 0  Easily annoyed or irritable 0  Afraid - awful might happen 0  Total GAD 7 Score 1  Anxiety Difficulty Not difficult at all     Assessment &  Plan:  ..Darlene Melendez was seen today for hypertension.  Diagnoses and all orders for this visit:  Essential hypertension, benign -     enalapril-hydrochlorothiazide (VASERETIC) 10-25 MG tablet; Take 1 tablet by mouth daily.  Screening for lipid disorders -     Lipid Panel w/reflex Direct LDL  Screening for diabetes mellitus -     COMPLETE METABOLIC PANEL WITH GFR  Class 1 obesity due to excess calories with serious comorbidity and body mass index (BMI) of 30.0 to 30.9 in adult -     TSH -     CBC  Acute pain of right knee -     diclofenac sodium (VOLTAREN) 1 % GEL; Apply 4 g topically 4 (four) times daily. To affected joint.  Primary osteoarthritis of knees, bilateral -     diclofenac sodium (VOLTAREN) 1 % GEL; Apply 4 g  topically 4 (four) times daily. To affected joint.   Needs fasting labs.   BP up a little. Weight also up. Discussed low salt diet/exercise and weight loss strategies.   Discussed knee pain likely arthritis vs mensical injury. Given knee exercises. REST, ICE, COMPRESS. Use ibuprofen as needed. Given diclofenac gel. If not improving consider injection.   Discussed shingles vaccine. Pt was given HO. Will consider.

## 2019-02-13 NOTE — Progress Notes (Signed)
Call pt:  Elevated glucose just a hair.  Please add a1c.  Kidney, liver and thyroid look good.   Cholesterol is up LDL 145, TG 238 10 year CV risk is 2.3 percent treatment not indicated until 7.5 or greater. Continue to work on diet and exercise.  I do think consider medication for TG. Would you like to try tricor?

## 2019-02-14 LAB — COMPLETE METABOLIC PANEL WITH GFR
AG Ratio: 1.9 (calc) (ref 1.0–2.5)
ALT: 19 U/L (ref 6–29)
AST: 15 U/L (ref 10–35)
Albumin: 4.2 g/dL (ref 3.6–5.1)
Alkaline phosphatase (APISO): 57 U/L (ref 37–153)
BUN: 11 mg/dL (ref 7–25)
CO2: 27 mmol/L (ref 20–32)
Calcium: 8.9 mg/dL (ref 8.6–10.4)
Chloride: 101 mmol/L (ref 98–110)
Creat: 0.76 mg/dL (ref 0.50–1.05)
GFR, Est African American: 106 mL/min/{1.73_m2} (ref >=60)
GFR, Est Non African American: 91 mL/min/{1.73_m2} (ref >=60)
Globulin: 2.2 g/dL (calc) (ref 1.9–3.7)
Glucose, Bld: 101 mg/dL — ABNORMAL HIGH (ref 65–99)
Potassium: 3.8 mmol/L (ref 3.5–5.3)
Sodium: 140 mmol/L (ref 135–146)
Total Bilirubin: 0.5 mg/dL (ref 0.2–1.2)
Total Protein: 6.4 g/dL (ref 6.1–8.1)

## 2019-02-14 LAB — CBC
HCT: 36.9 % (ref 35.0–45.0)
Hemoglobin: 12.7 g/dL (ref 11.7–15.5)
MCH: 32.2 pg (ref 27.0–33.0)
MCHC: 34.4 g/dL (ref 32.0–36.0)
MCV: 93.7 fL (ref 80.0–100.0)
MPV: 10.9 fL (ref 7.5–12.5)
Platelets: 229 10*3/uL (ref 140–400)
RBC: 3.94 10*6/uL (ref 3.80–5.10)
RDW: 12.3 % (ref 11.0–15.0)
WBC: 5.9 10*3/uL (ref 3.8–10.8)

## 2019-02-14 LAB — TSH: TSH: 1.83 mIU/L

## 2019-02-14 LAB — HEMOGLOBIN A1C W/OUT EAG: Hgb A1c MFr Bld: 5.7 % of total Hgb — ABNORMAL HIGH (ref <5.7)

## 2019-02-14 LAB — LIPID PANEL W/REFLEX DIRECT LDL
Cholesterol: 235 mg/dL — ABNORMAL HIGH (ref <200)
HDL: 52 mg/dL (ref >=50)
LDL Cholesterol (Calc): 145 mg/dL (calc) — ABNORMAL HIGH
Non-HDL Cholesterol (Calc): 183 mg/dL (calc) — ABNORMAL HIGH (ref <130)
Total CHOL/HDL Ratio: 4.5 (calc) (ref <5.0)
Triglycerides: 238 mg/dL — ABNORMAL HIGH (ref <150)

## 2019-02-14 MED ORDER — FENOFIBRATE 145 MG PO TABS: 145.0000 mg | ORAL_TABLET | Freq: Every day | ORAL | 3 refills | Status: DC

## 2019-02-14 NOTE — Addendum Note (Signed)
Addended by: Donella Stade on: 02/14/2019 11:14 AM   Modules accepted: Orders

## 2019-02-14 NOTE — Progress Notes (Signed)
For triglycerides add fish oil 4000mg  daily and consider tricor. Let me know.  A1C was 5.7 you are in pre-diabetes range. We will continue to watch this but diet and exercise can help to resolve this. Avoid excessive sugars/carbs/processed foods.

## 2019-02-14 NOTE — Progress Notes (Signed)
Sent tricor.

## 2019-03-08 ENCOUNTER — Encounter: Payer: Self-pay | Admitting: Physician Assistant

## 2019-03-17 ENCOUNTER — Other Ambulatory Visit: Payer: Self-pay | Admitting: Physician Assistant

## 2019-03-17 DIAGNOSIS — E781 Pure hyperglyceridemia: Secondary | ICD-10-CM

## 2019-08-05 ENCOUNTER — Other Ambulatory Visit: Payer: Self-pay | Admitting: Physician Assistant

## 2019-08-05 DIAGNOSIS — I10 Essential (primary) hypertension: Secondary | ICD-10-CM

## 2019-09-17 ENCOUNTER — Ambulatory Visit (INDEPENDENT_AMBULATORY_CARE_PROVIDER_SITE_OTHER): Payer: PRIVATE HEALTH INSURANCE | Admitting: Physician Assistant

## 2019-09-17 ENCOUNTER — Encounter: Payer: Self-pay | Admitting: Physician Assistant

## 2019-09-17 ENCOUNTER — Other Ambulatory Visit: Payer: Self-pay

## 2019-09-17 VITALS — BP 110/53 | HR 85 | Ht 66.0 in | Wt 214.0 lb

## 2019-09-17 DIAGNOSIS — I10 Essential (primary) hypertension: Secondary | ICD-10-CM | POA: Diagnosis not present

## 2019-09-17 MED ORDER — ENALAPRIL-HYDROCHLOROTHIAZIDE 10-25 MG PO TABS: 1.0000 | ORAL_TABLET | Freq: Every day | ORAL | 3 refills | Status: DC

## 2019-09-17 NOTE — Progress Notes (Signed)
   Subjective:    Patient ID: Darlene Melendez, female    DOB: 05-Jan-1971, 49 y.o.   MRN: [id number]  HPI Pt is a 49 yo female with HTN, hypertriglyceridemia, MDD who presents to the clinic for med refill on BP medications.   Pt is doing well. Denies any CP, palpitations, dizziness, swelling, headache, or vision changes. She takes her medications daily.  .. Active Ambulatory Problems    Diagnosis Date Noted  . Migraine without aura 03/31/2007  . ESSENTIAL HYPERTENSION, BENIGN 03/31/2007  . GALLSTONES 08/13/2009  . NECK STIFFNESS 06/09/2007  . ROTATOR CUFF SYNDROME, RIGHT 05/27/2008  . Weight gain 03/31/2007  . Obesity 08/13/2014  . Hyperlipidemia 08/16/2014  . Hypokalemia 09/01/2015  . Cephalalgia 09/01/2015  . Elevated liver enzymes 09/02/2015  . Skin lesion 03/22/2016  . Knee crepitus 03/22/2016  . Primary osteoarthritis of knees, bilateral 03/22/2016  . Hypertriglyceridemia 04/07/2016  . Accessory skin tags 09/07/2017   Resolved Ambulatory Problems    Diagnosis Date Noted  . Acute sinusitis, unspecified 04/04/2009  . RUQ PAIN 08/06/2009  . VIRAL URI 05/06/2010  . Skin macule 01/01/2015  . CAP (community acquired pneumonia) 09/01/2015  . Obesity (BMI 35.0-39.9 without comorbidity) 10/12/2016  . Influenza-like illness 05/30/2017  . Acute non-recurrent maxillary sinusitis 04/03/2018   Past Medical History:  Diagnosis Date  . Anxiety   . Hypertension   . Migraines   . Pneumonia      Review of Systems See HPI.     Objective:   Physical Exam Vitals reviewed.  Constitutional:      Appearance: Normal appearance. She is obese.  Cardiovascular:     Rate and Rhythm: Normal rate and regular rhythm.     Pulses: Normal pulses.     Heart sounds: No murmur.  Pulmonary:     Effort: Pulmonary effort is normal.     Breath sounds: Normal breath sounds.  Musculoskeletal:     Right lower leg: No edema.     Left lower leg: No edema.  Neurological:     Melendez: No focal  deficit present.     Mental Status: She is alert and oriented to person, place, and time.  Psychiatric:        Mood and Affect: Mood normal.    .. Depression screen [facility] 2/9 09/17/2019 02/09/2019 09/07/2017  Decreased Interest 0 0 0  Down, Depressed, Hopeless 0 0 0  PHQ - 2 Score 0 0 0  Altered sleeping 0 0 -  Tired, decreased energy 1 1 -  Change in appetite 0 0 -  Feeling bad or failure about yourself  0 0 -  Trouble concentrating 0 0 -  Moving slowly or fidgety/restless 0 0 -  Suicidal thoughts 0 0 -  PHQ-9 Score 1 1 -  Difficult doing work/chores Not difficult at all Not difficult at all -          Assessment & Plan:  Marland KitchenMarland KitchenJanequa was seen today for hypertension.  Diagnoses and all orders for this visit:  Essential hypertension, benign -     enalapril-hydrochlorothiazide (VASERETIC) 10-25 MG tablet; Take 1 tablet by mouth daily.   BP perfect. Refilled medications. Labs UTD. Recheck in 1 year.   Mood managed by [facility]. On celexa. PHQ9 looks great.  Colonoscopy- 11/2018 Dr. Ky Barban will abstract.

## 2020-02-29 ENCOUNTER — Other Ambulatory Visit: Payer: Self-pay | Admitting: Physician Assistant

## 2020-02-29 DIAGNOSIS — E781 Pure hyperglyceridemia: Secondary | ICD-10-CM

## 2020-03-25 ENCOUNTER — Other Ambulatory Visit: Payer: Self-pay | Admitting: Physician Assistant

## 2020-03-25 DIAGNOSIS — E781 Pure hyperglyceridemia: Secondary | ICD-10-CM

## 2020-04-06 ENCOUNTER — Other Ambulatory Visit: Payer: Self-pay | Admitting: Physician Assistant

## 2020-04-06 DIAGNOSIS — E781 Pure hyperglyceridemia: Secondary | ICD-10-CM

## 2020-06-16 ENCOUNTER — Telehealth: Payer: Self-pay

## 2020-06-16 NOTE — Telephone Encounter (Signed)
Transition Care Management Unsuccessful Follow-up Telephone Call  Date of discharge and from where:  [facility] 06/14/2020  Attempts:  1st Attempt  Reason for unsuccessful TCM follow-up call:  Unable to leave message

## 2020-06-17 NOTE — Telephone Encounter (Signed)
Transition Care Management Unsuccessful Follow-up Telephone Call  Date of discharge and from where:  06/14/2020 from [facility].   Attempts:  2nd Attempt  Reason for unsuccessful TCM follow-up call:  Voice mail full

## 2020-06-18 NOTE — Telephone Encounter (Signed)
Transition Care Management Unsuccessful Follow-up Telephone Call  Date of discharge and from where:  06/14/2020 from [facility]  Attempts:  3rd Attempt  Reason for unsuccessful TCM follow-up call:  Unable to reach patient

## 2020-06-20 ENCOUNTER — Other Ambulatory Visit: Payer: Self-pay

## 2020-06-20 ENCOUNTER — Ambulatory Visit (INDEPENDENT_AMBULATORY_CARE_PROVIDER_SITE_OTHER): Payer: PRIVATE HEALTH INSURANCE | Admitting: Physician Assistant

## 2020-06-20 ENCOUNTER — Encounter: Payer: Self-pay | Admitting: Physician Assistant

## 2020-06-20 VITALS — BP 134/84 | HR 78 | Ht 66.0 in | Wt 207.0 lb

## 2020-06-20 DIAGNOSIS — Z780 Asymptomatic menopausal state: Secondary | ICD-10-CM

## 2020-06-20 DIAGNOSIS — I10 Essential (primary) hypertension: Secondary | ICD-10-CM

## 2020-06-20 DIAGNOSIS — R7301 Impaired fasting glucose: Secondary | ICD-10-CM

## 2020-06-20 DIAGNOSIS — Z79899 Other long term (current) drug therapy: Secondary | ICD-10-CM | POA: Diagnosis not present

## 2020-06-20 DIAGNOSIS — G43009 Migraine without aura, not intractable, without status migrainosus: Secondary | ICD-10-CM

## 2020-06-20 DIAGNOSIS — Z131 Encounter for screening for diabetes mellitus: Secondary | ICD-10-CM

## 2020-06-20 DIAGNOSIS — E781 Pure hyperglyceridemia: Secondary | ICD-10-CM

## 2020-06-20 DIAGNOSIS — Z13220 Encounter for screening for lipoid disorders: Secondary | ICD-10-CM

## 2020-06-20 DIAGNOSIS — Z1329 Encounter for screening for other suspected endocrine disorder: Secondary | ICD-10-CM

## 2020-06-20 DIAGNOSIS — G44209 Tension-type headache, unspecified, not intractable: Secondary | ICD-10-CM

## 2020-06-20 MED ORDER — ENALAPRIL-HYDROCHLOROTHIAZIDE 10-25 MG PO TABS: 1.0000 | ORAL_TABLET | Freq: Every day | ORAL | 3 refills | Status: DC

## 2020-06-20 MED ORDER — ONDANSETRON 8 MG PO TBDP: 8.0000 mg | ORAL_TABLET | Freq: Three times a day (TID) | ORAL | 1 refills | Status: DC | PRN

## 2020-06-20 MED ORDER — BACLOFEN 10 MG PO TABS: ORAL_TABLET | ORAL | 0 refills | Status: DC

## 2020-06-20 MED ORDER — RIZATRIPTAN BENZOATE 10 MG PO TABS: 10.0000 mg | ORAL_TABLET | ORAL | 1 refills | Status: DC | PRN

## 2020-06-20 NOTE — Progress Notes (Signed)
Subjective:    Patient ID: Darlene Melendez, female    DOB: 1971/01/15, 50 y.o.   MRN: [id number]  HPI  Patient is a 50 year old female with history of hypertension, hypertriglyceridemia, migraines without aura who presents to the clinic to follow-up after ED visit on 06/14/2020 for non-intractable headache.  She actually called EMS the morning of 2/19 because of a severe headache that worsened when she got out of the shower.  She had nausea, vomiting, diarrhea. No vision changes or extremity weakness or speech changes.  She had been noticing her migraines have been increasing in frequency to 2 or 3 a month.  Her CT of her head was normal.  She did have a CBC, CMP which showed some elevated calcium. She was given a migraine cocktail of injections and told to follow up.   Her HA has been better but still having more than she normally does.   She usually takes excedrin or ibuprofen for migraine/headache.  .. Active Ambulatory Problems    Diagnosis Date Noted  . Migraine without aura 03/31/2007  . ESSENTIAL HYPERTENSION, BENIGN 03/31/2007  . GALLSTONES 08/13/2009  . NECK STIFFNESS 06/09/2007  . ROTATOR CUFF SYNDROME, RIGHT 05/27/2008  . Weight gain 03/31/2007  . Obesity 08/13/2014  . Hyperlipidemia 08/16/2014  . Hypokalemia 09/01/2015  . Cephalalgia 09/01/2015  . Elevated liver enzymes 09/02/2015  . Skin lesion 03/22/2016  . Knee crepitus 03/22/2016  . Primary osteoarthritis of knees, bilateral 03/22/2016  . Hypertriglyceridemia 04/07/2016  . Accessory skin tags 09/07/2017  . Post-menopausal 06/23/2020  . Serum calcium elevated 06/23/2020  . Thyroid disorder screen 06/23/2020  . Elevated fasting glucose 06/23/2020  . Acute non intractable tension-type headache 06/23/2020   Resolved Ambulatory Problems    Diagnosis Date Noted  . Acute sinusitis, unspecified 04/04/2009  . RUQ PAIN 08/06/2009  . VIRAL URI 05/06/2010  . Skin macule 01/01/2015  . CAP (community acquired pneumonia)  09/01/2015  . Obesity (BMI 35.0-39.9 without comorbidity) 10/12/2016  . Influenza-like illness 05/30/2017  . Acute non-recurrent maxillary sinusitis 04/03/2018   Past Medical History:  Diagnosis Date  . Anxiety   . Hypertension   . Migraines   . Pneumonia        Review of Systems  All other systems reviewed and are negative.      Objective:   Physical Exam Vitals reviewed.  Constitutional:      Appearance: She is well-developed. She is obese.  HENT:     Head: Normocephalic.  Cardiovascular:     Rate and Rhythm: Normal rate and regular rhythm.     Heart sounds: Normal heart sounds.  Pulmonary:     Effort: Pulmonary effort is normal.     Breath sounds: Normal breath sounds.  Abdominal:     General: Bowel sounds are normal. There is no distension.     Palpations: Abdomen is soft.     Tenderness: There is no abdominal tenderness.  Musculoskeletal:        General: No swelling.     Cervical back: Normal range of motion and neck supple.  Lymphadenopathy:     Cervical: No cervical adenopathy.  Skin:    Coloration: Skin is not pale.  Neurological:     Mental Status: She is alert.     Cranial Nerves: No cranial nerve deficit.  Psychiatric:        Mood and Affect: Mood normal.           Assessment & Plan:  Marland KitchenMarland KitchenElizah was seen today  for headache.  Diagnoses and all orders for this visit:  Migraine without aura and without status migrainosus, not intractable -     ondansetron (ZOFRAN-ODT) 8 MG disintegrating tablet; Take 1 tablet (8 mg total) by mouth every 8 (eight) hours as needed for nausea. -     rizatriptan (MAXALT) 10 MG tablet; Take 1 tablet (10 mg total) by mouth as needed for migraine. May repeat in 2 hours if needed  Medication management -     CBC with Differential/Platelet -     COMPLETE METABOLIC PANEL WITH GFR -     Hemoglobin A1c -     Lipid Panel w/reflex Direct LDL -     TSH  Screening for lipid disorders -     Lipid Panel w/reflex Direct  LDL  Screening for diabetes mellitus -     COMPLETE METABOLIC PANEL WITH GFR -     Hemoglobin A1c  Elevated fasting glucose -     Hemoglobin A1c  Hypertriglyceridemia -     Lipid Panel w/reflex Direct LDL  Essential hypertension, benign -     COMPLETE METABOLIC PANEL WITH GFR -     enalapril-hydrochlorothiazide (VASERETIC) 10-25 MG tablet; Take 1 tablet by mouth daily.  Thyroid disorder screen -     TSH  Serum calcium elevated -     PTH, Intact and Calcium -     VITAMIN D 25 Hydroxy (Vit-D Deficiency, Fractures)  Post-menopausal -     VITAMIN D 25 Hydroxy (Vit-D Deficiency, Fractures)  Acute non intractable tension-type headache -     ondansetron (ZOFRAN-ODT) 8 MG disintegrating tablet; Take 1 tablet (8 mg total) by mouth every 8 (eight) hours as needed for nausea. -     baclofen (LIORESAL) 10 MG tablet; Take one tablet at bedtime. -     rizatriptan (MAXALT) 10 MG tablet; Take 1 tablet (10 mg total) by mouth as needed for migraine. May repeat in 2 hours if needed   Patient has actually not been seen in a while.  She does need some screening labs. She reports she is no longer having any periods and having postmenopausal symptoms.  Discussed symptomatic care for postmenopausal symptoms.  Her calcium was elevated in ED. Recheck calcium and add PTH.  Discussed migraine/headaches. Reassuring normal CT. With the recent worsening unclear etiology. Will give her some medication to use as needed.  For more tension and neck headache use baclofen at bedtime and get a supportive pillow.  zofran as needed for nausea with headaches.  maxalt to try for rescue. Ok to use with excedrin migraine. Hx of issues with imitrex discussed to watch out for same side effects.   Follow up in 2-3 months.

## 2020-06-23 DIAGNOSIS — G44209 Tension-type headache, unspecified, not intractable: Secondary | ICD-10-CM | POA: Insufficient documentation

## 2020-06-23 DIAGNOSIS — R7301 Impaired fasting glucose: Secondary | ICD-10-CM | POA: Insufficient documentation

## 2020-06-23 DIAGNOSIS — Z780 Asymptomatic menopausal state: Secondary | ICD-10-CM | POA: Insufficient documentation

## 2020-06-23 DIAGNOSIS — Z1329 Encounter for screening for other suspected endocrine disorder: Secondary | ICD-10-CM | POA: Insufficient documentation

## 2020-06-24 LAB — LIPID PANEL
Cholesterol: 218 — AB (ref 0–200)
HDL: 66 (ref 35–70)
LDL Cholesterol: 112
Triglycerides: 201 — AB (ref 40–160)

## 2020-06-24 LAB — HEMOGLOBIN A1C: Hemoglobin A1C: 5.8

## 2020-06-24 LAB — BASIC METABOLIC PANEL
BUN: 20 (ref 4–21)
CO2: 35 — AB (ref 13–22)
Chloride: 103 (ref 99–108)
Creatinine: 0.6 (ref 0.5–1.1)
Glucose: 109
Potassium: 3.9 (ref 3.4–5.3)
Sodium: 144 (ref 137–147)

## 2020-06-24 LAB — TSH: TSH: 1.35 (ref 0.41–5.90)

## 2020-06-24 LAB — COMPREHENSIVE METABOLIC PANEL
Albumin: 4.4 (ref 3.5–5.0)
Calcium: 9.6 (ref 8.7–10.7)
GFR calc Af Amer: 122.3
GFR calc non Af Amer: 105.5

## 2020-06-24 LAB — CBC AND DIFFERENTIAL
HCT: 39 (ref 36–46)
Hemoglobin: 12.8 (ref 12.0–16.0)
Platelets: 231 (ref 150–399)
WBC: 4.8

## 2020-06-24 LAB — HEPATIC FUNCTION PANEL
ALT: 47 — AB (ref 7–35)
AST: 35 (ref 13–35)
Alkaline Phosphatase: 51 (ref 25–125)
Bilirubin, Total: 0.6

## 2020-06-24 LAB — CBC: RBC: 4.02 (ref 3.87–5.11)

## 2020-06-24 LAB — VITAMIN D 25 HYDROXY (VIT D DEFICIENCY, FRACTURES): Vit D, 25-Hydroxy: 64.7

## 2020-07-02 ENCOUNTER — Encounter: Payer: Self-pay | Admitting: Physician Assistant

## 2020-07-02 DIAGNOSIS — R748 Abnormal levels of other serum enzymes: Secondary | ICD-10-CM

## 2020-07-02 DIAGNOSIS — E781 Pure hyperglyceridemia: Secondary | ICD-10-CM

## 2020-07-04 NOTE — Telephone Encounter (Signed)
Patient had these drawn at [employer] (she works there) and faxed over. In basket for review. Please advise.

## 2020-07-08 ENCOUNTER — Encounter: Payer: Self-pay | Admitting: Physician Assistant

## 2020-07-08 NOTE — Telephone Encounter (Signed)
I remember sending these to scan but don't see in chart?

## 2020-07-09 MED ORDER — FENOFIBRATE 145 MG PO TABS: ORAL_TABLET | ORAL | 3 refills | Status: DC

## 2020-07-11 NOTE — Telephone Encounter (Signed)
Lab req faxed to number provided

## 2020-07-11 NOTE — Addendum Note (Signed)
Addended by: Silvio Pate on: 07/11/2020 09:38 AM   Modules accepted: Orders

## 2020-07-12 ENCOUNTER — Other Ambulatory Visit: Payer: Self-pay | Admitting: Physician Assistant

## 2020-07-12 DIAGNOSIS — G44209 Tension-type headache, unspecified, not intractable: Secondary | ICD-10-CM

## 2020-07-13 ENCOUNTER — Other Ambulatory Visit: Payer: Self-pay | Admitting: Physician Assistant

## 2020-07-13 DIAGNOSIS — G44209 Tension-type headache, unspecified, not intractable: Secondary | ICD-10-CM

## 2020-07-13 DIAGNOSIS — G43009 Migraine without aura, not intractable, without status migrainosus: Secondary | ICD-10-CM

## 2020-07-16 ENCOUNTER — Other Ambulatory Visit: Payer: Self-pay | Admitting: Physician Assistant

## 2020-07-16 DIAGNOSIS — G44209 Tension-type headache, unspecified, not intractable: Secondary | ICD-10-CM

## 2021-01-22 ENCOUNTER — Other Ambulatory Visit: Payer: Self-pay

## 2021-01-22 ENCOUNTER — Emergency Department (INDEPENDENT_AMBULATORY_CARE_PROVIDER_SITE_OTHER)
Admission: RE | Admit: 2021-01-22 | Discharge: 2021-01-22 | Disposition: A | Discharge status: Home / Self Care | Payer: No Typology Code available for payment source | Source: Ambulatory Visit

## 2021-01-22 ENCOUNTER — Telehealth: Payer: Self-pay | Admitting: Family Medicine

## 2021-01-22 VITALS — BP 126/84 | HR 75 | Temp 99.0°F | Resp 18

## 2021-01-22 DIAGNOSIS — R059 Cough, unspecified: Secondary | ICD-10-CM | POA: Diagnosis not present

## 2021-01-22 DIAGNOSIS — J3489 Other specified disorders of nose and nasal sinuses: Secondary | ICD-10-CM | POA: Diagnosis not present

## 2021-01-22 MED ORDER — METHYLPREDNISOLONE 4 MG PO TBPK: ORAL_TABLET | ORAL | 0 refills | Status: DC

## 2021-01-22 MED ORDER — CEFDINIR 300 MG PO CAPS: 300.0000 mg | ORAL_CAPSULE | Freq: Two times a day (BID) | ORAL | 0 refills | Status: AC

## 2021-01-22 MED ORDER — METHYLPREDNISOLONE SODIUM SUCC 125 MG IJ SOLR
125.0000 mg | Freq: Once | INTRAMUSCULAR | Status: AC
  Administered 2021-01-22: 125 mg via INTRAMUSCULAR

## 2021-01-22 NOTE — ED Provider Notes (Signed)
[facility]    CSN: [id number] Arrival date & time: 01/22/21  1049      History   Chief Complaint Chief Complaint  Patient presents with   Otalgia   Nasal Congestion    HPI Darlene Melendez is a 50 y.o. female.   HPI 23-year-old female presents with productive cough nasal congestion and bilateral ear pain for 1 week reports was evaluated and treated with antibiotics on Monday; however, no relief.  Patient reports was prescribed amoxicillin, Tessalon Perles, and decongestion on Monday, 01/19/2021.  Past Medical History:  Diagnosis Date   Anxiety    Hypertension    Migraines    Pneumonia     Patient Active Problem List   Diagnosis Date Noted   Post-menopausal 06/23/2020   Serum calcium elevated 06/23/2020   Thyroid disorder screen 06/23/2020   Elevated fasting glucose 06/23/2020   Acute non intractable tension-type headache 06/23/2020   Accessory skin tags 09/07/2017   Hypertriglyceridemia 04/07/2016   Skin lesion 03/22/2016   Knee crepitus 03/22/2016   Primary osteoarthritis of knees, bilateral 03/22/2016   Elevated liver enzymes 09/02/2015   Hypokalemia 09/01/2015   Cephalalgia 09/01/2015   Hyperlipidemia 08/16/2014   Obesity 08/13/2014   GALLSTONES 08/13/2009   ROTATOR CUFF SYNDROME, 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      Home Medications    Prior to Admission medications   Medication Sig Start Date End Date Taking? Authorizing Provider  baclofen (LIORESAL) 10 MG tablet TAKE 1 TABLET BY MOUTH EVERYDAY AT BEDTIME 07/14/20   Breeback, Jade L, PA-C  cholecalciferol (VITAMIN D3) 25 MCG (1000 UT) tablet Take 1,000 Units by mouth daily.    [provider]  citalopram (CELEXA) 20 MG tablet Take 20  mg by mouth daily.    [provider]  enalapril-hydrochlorothiazide (VASERETIC) 10-25 MG tablet Take 1 tablet by mouth daily. 06/20/20   Breeback, Lonna Cobb, PA-C  fenofibrate (TRICOR) 145 MG tablet TAKE 1 TABLET BY MOUTH DAILY 07/09/20   Breeback, Jade L, PA-C  ibuprofen (ADVIL,MOTRIN) 800 MG tablet Take 1 tablet (800 mg total) by mouth every 8 (eight) hours as needed. 10/12/16   Breeback, Jade L, PA-C  KRILL OIL PO Take by mouth daily.    [provider]  ondansetron (ZOFRAN-ODT) 8 MG disintegrating tablet Take 1 tablet (8 mg total) by mouth every 8 (eight) hours as needed for nausea. 06/20/20   Breeback, Jade L, PA-C  rizatriptan (MAXALT) 10 MG tablet TAKE 1 TABLET BY MOUTH AS NEEDED FOR MIGRAINE. MAY REPEAT IN 2 HOURS IF NEEDED 07/14/20   Jomarie Longs, PA-C    Family History Family History  Problem Relation Age of Onset   Hypertension Mother    Hypertension Father    Diabetes Father    Cancer Father     Social History Social History   Tobacco Use   Smoking status: Never   Smokeless tobacco: Never  Vaping Use   Vaping Use: Never used  Substance Use Topics   Alcohol use: Yes    Comment: occasional   Drug use: No     Allergies   Imitrex [sumatriptan]   Review of Systems Review of Systems  HENT:  Positive for congestion, sinus pressure and  sinus pain.   Respiratory:  Positive for cough.   All other systems reviewed and are negative.   Physical Exam Triage Vital Signs ED Triage Vitals  Enc Vitals Group     BP      Pulse      Resp      Temp      Temp src      SpO2      Weight      Height      Head Circumference      Peak Flow      Pain Score      Pain Loc      Pain Edu?      Excl. in [place]?    No data found.  Updated Vital Signs BP 126/84 (BP Location: Right Arm)   Pulse 75   Temp 99 F (37.2 C) (Oral)   Resp 18   LMP 04/05/2018   SpO2 97%       Physical Exam Vitals and nursing note reviewed.  Constitutional:      General: She  is not in acute distress.    Appearance: Normal appearance. She is normal weight. She is not ill-appearing.  HENT:     Head: Normocephalic and atraumatic.     Right Ear: Hearing, tympanic membrane and external ear normal.     Left Ear: Hearing, tympanic membrane and external ear normal.     Ears:     Comments: Moderate eustachian tube dysfunction noted bilaterally    Nose:     Right Sinus: Maxillary sinus tenderness present.     Left Sinus: Maxillary sinus tenderness present.     Mouth/Throat:     Mouth: Mucous membranes are moist.     Pharynx: Oropharynx is clear.  Eyes:     Extraocular Movements: Extraocular movements intact.     Conjunctiva/sclera: Conjunctivae normal.     Pupils: Pupils are equal, round, and reactive to light.  Cardiovascular:     Rate and Rhythm: Normal rate and regular rhythm.     Pulses: Normal pulses.     Heart sounds: Normal heart sounds.  Pulmonary:     Effort: Pulmonary effort is normal.     Breath sounds: Normal breath sounds. No wheezing, rhonchi or rales.  Musculoskeletal:        General: Normal range of motion.     Cervical back: Normal range of motion and neck supple.  Skin:    General: Skin is warm and dry.  Neurological:     General: No focal deficit present.     Mental Status: She is alert and oriented to person, place, and time. Mental status is at baseline.  Psychiatric:        Mood and Affect: Mood normal.        Behavior: Behavior normal.        Thought Content: Thought content normal.     UC Treatments / Results  Labs (all labs ordered are listed, but only abnormal results are displayed) Labs Reviewed - No data to display  EKG   Radiology No results found.  Procedures Procedures (including critical care time)  Medications Ordered in UC Medications  methylPREDNISolone sodium succinate (SOLU-MEDROL) 125 mg/2 mL injection 125 mg (125 mg Intramuscular Given 01/22/21 1127)    Initial Impression / Assessment and Plan / UC  Course  I have reviewed the triage vital signs and the nursing notes.  Pertinent labs & imaging results that were available during my care of the  patient were reviewed by me and considered in my medical decision making (see chart for details).     MDM: 1.  Cough-Rx'd Medrol Dosepak; 2.  Sinus pressure-IM Solu-Medrol 125 mg given once in clinic prior to discharge today. Advised/instructed patient to start Medrol Dosepak tomorrow morning, Friday, 01/23/2021.  Advised patient to take medication as directed with food to completion.  Encouraged patient increase daily water intake while taking this medication.  Patient discharged home, hemodynamically stable. Final Clinical Impressions(s) / UC Diagnoses   Final diagnoses:  Cough  Sinus pressure     Discharge Instructions      Advised/instructed patient to start Medrol Dosepak tomorrow morning, Friday, 01/23/2021.  Advised patient to take medication as directed with food to completion.  Encouraged patient increase daily water intake while taking this medication.     ED Prescriptions   None    PDMP not reviewed this encounter.   Trevor Iha, FNP 01/22/21 1138

## 2021-01-22 NOTE — ED Triage Notes (Signed)
Pt present productive cough with nasal congestion and bilateral ear pressure.  Also she is having facial pain and pressure around her nose and cheek area. Pt states symptoms started last week ago. She was recently (Monday) prescribed an antibiotics and other medication but no relief

## 2021-01-22 NOTE — Telephone Encounter (Signed)
Medication not sent to pharmacy, and now as been done.

## 2021-01-22 NOTE — Discharge Instructions (Addendum)
Advised/instructed patient to start Medrol Dosepak tomorrow morning, Friday, 01/23/2021.  Advised patient to take medication as directed with food to completion.  Encouraged patient increase daily water intake while taking this medication.

## 2021-06-01 ENCOUNTER — Emergency Department: Admit: 2021-06-01 | Discharge status: Absent without leave | Payer: Self-pay

## 2021-06-01 ENCOUNTER — Emergency Department (INDEPENDENT_AMBULATORY_CARE_PROVIDER_SITE_OTHER)
Admission: EM | Admit: 2021-06-01 | Discharge: 2021-06-01 | Disposition: A | Discharge status: Home / Self Care | Payer: No Typology Code available for payment source | Source: Home / Self Care

## 2021-06-01 ENCOUNTER — Other Ambulatory Visit: Payer: Self-pay

## 2021-06-01 DIAGNOSIS — J309 Allergic rhinitis, unspecified: Secondary | ICD-10-CM | POA: Diagnosis not present

## 2021-06-01 DIAGNOSIS — J0100 Acute maxillary sinusitis, unspecified: Secondary | ICD-10-CM

## 2021-06-01 DIAGNOSIS — R059 Cough, unspecified: Secondary | ICD-10-CM

## 2021-06-01 MED ORDER — AMOXICILLIN-POT CLAVULANATE 875-125 MG PO TABS: 1.0000 | ORAL_TABLET | Freq: Two times a day (BID) | ORAL | 0 refills | Status: AC

## 2021-06-01 MED ORDER — BENZONATATE 200 MG PO CAPS: 200.0000 mg | ORAL_CAPSULE | Freq: Three times a day (TID) | ORAL | 0 refills | Status: AC | PRN

## 2021-06-01 MED ORDER — FEXOFENADINE HCL 180 MG PO TABS: 180.0000 mg | ORAL_TABLET | Freq: Every day | ORAL | 0 refills | Status: DC

## 2021-06-01 MED ORDER — PROMETHAZINE-DM 6.25-15 MG/5ML PO SYRP: 5.0000 mL | ORAL_SOLUTION | Freq: Two times a day (BID) | ORAL | 0 refills | Status: DC | PRN

## 2021-06-01 MED ORDER — PREDNISONE 20 MG PO TABS: ORAL_TABLET | ORAL | 0 refills | Status: DC

## 2021-06-01 NOTE — ED Provider Notes (Signed)
[facility]    CSN: [id number] Arrival date & time: 06/01/21  1631      History   Chief Complaint Chief Complaint  Patient presents with   Cough   Nasal Congestion    HPI TRANICE STIFF is a 51 y.o. female.   HPI 51 year old female presents with cough, bilateral ear pressure and sinus nasal congestion x 6 days.  PMH significant for HTN and migraines.  Past Medical History:  Diagnosis Date   Anxiety    Hypertension    Migraines    Pneumonia     Patient Active Problem List   Diagnosis Date Noted   Post-menopausal 06/23/2020   Serum calcium elevated 06/23/2020   Thyroid disorder screen 06/23/2020   Elevated fasting glucose 06/23/2020   Acute non intractable tension-type headache 06/23/2020   Accessory skin tags 09/07/2017   Hypertriglyceridemia 04/07/2016   Skin lesion 03/22/2016   Knee crepitus 03/22/2016   Primary osteoarthritis of knees, bilateral 03/22/2016   Elevated liver enzymes 09/02/2015   Hypokalemia 09/01/2015   Cephalalgia 09/01/2015   Hyperlipidemia 08/16/2014   Obesity 08/13/2014   GALLSTONES 08/13/2009   ROTATOR CUFF SYNDROME, 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      Home Medications    Prior to Admission medications   Medication Sig Start Date End Date Taking? Authorizing Provider  amoxicillin-clavulanate (AUGMENTIN) 875-125 MG tablet Take 1 tablet by mouth 2 (two) times daily for 10 days. 06/01/21 06/11/21 Yes Eliezer Lofts, FNP  benzonatate (TESSALON) 200 MG capsule Take 1 capsule (200 mg total) by mouth 3 (three) times daily as needed for up to 7 days for cough. 06/01/21 06/08/21 Yes Eliezer Lofts, FNP  fexofenadine [facility] ALLERGY) 180 MG tablet Take 1 tablet (180 mg total) by mouth  daily for 15 days. 06/01/21 06/16/21 Yes Eliezer Lofts, FNP  ipratropium (ATROVENT) 0.03 % nasal spray Place 2 sprays into both nostrils every 12 (twelve) hours.   Yes [provider]  predniSONE (DELTASONE) 20 MG tablet Take 3 tabs PO daily x 5 days. 06/01/21  Yes Eliezer Lofts, FNP  promethazine-dextromethorphan (PROMETHAZINE-DM) 6.25-15 MG/5ML syrup Take 5 mLs by mouth 2 (two) times daily as needed for cough. 06/01/21  Yes Eliezer Lofts, FNP  baclofen (LIORESAL) 10 MG tablet TAKE 1 TABLET BY MOUTH EVERYDAY AT BEDTIME 07/14/20   Breeback, Jade L, PA-C  cholecalciferol (VITAMIN D3) 25 MCG (1000 UT) tablet Take 1,000 Units by mouth daily.    [provider]  citalopram (CELEXA) 20 MG tablet Take 20 mg by mouth daily.    [provider]  enalapril-hydrochlorothiazide (VASERETIC) 10-25 MG tablet Take 1 tablet by mouth daily. 06/20/20   Breeback, Royetta Car, PA-C  fenofibrate (TRICOR) 145 MG tablet TAKE 1 TABLET BY MOUTH DAILY 07/09/20   Breeback, Jade L, PA-C  ibuprofen (ADVIL,MOTRIN) 800 MG tablet Take 1 tablet (800 mg total) by mouth every 8 (eight) hours as needed. 10/12/16   Breeback, Jade L, PA-C  KRILL OIL PO Take by mouth daily.    [provider]  ondansetron (ZOFRAN-ODT) 8 MG disintegrating tablet Take 1 tablet (8 mg total) by mouth every 8 (eight) hours as needed for nausea. 06/20/20   Breeback, Jade L, PA-C  rizatriptan (MAXALT) 10 MG  tablet TAKE 1 TABLET BY MOUTH AS NEEDED FOR MIGRAINE. MAY REPEAT IN 2 HOURS IF NEEDED 07/14/20   Donella Stade, PA-C    Family History Family History  Problem Relation Age of Onset   Hypertension Mother    Hypertension Father    Diabetes Father    Cancer Father     Social History Social History   Tobacco Use   Smoking status: Never   Smokeless tobacco: Never  Vaping Use   Vaping Use: Never used  Substance Use Topics   Alcohol use: Yes    Comment: occasional   Drug use: No     Allergies   Imitrex  [sumatriptan]   Review of Systems Review of Systems  HENT:  Positive for congestion.        Bilateral ear pressure x 6 days  Respiratory:  Positive for cough.   All other systems reviewed and are negative.   Physical Exam Triage Vital Signs ED Triage Vitals  Enc Vitals Group     BP 06/01/21 1809 121/79     Pulse Rate 06/01/21 1809 72     Resp 06/01/21 1809 20     Temp 06/01/21 1809 98.2 F (36.8 C)     Temp Source 06/01/21 1809 Oral     SpO2 06/01/21 1809 95 %     Weight 06/01/21 1803 215 lb (97.5 kg)     Height 06/01/21 1803 5\' 6"  (1.676 m)     Head Circumference --      Peak Flow --      Pain Score 06/01/21 1802 0     Pain Loc --      Pain Edu? --      Excl. in [place]? --    No data found.  Updated Vital Signs BP 121/79 (BP Location: Right Arm)    Pulse 72    Temp 98.2 F (36.8 C) (Oral)    Resp 20    Ht 5\' 6"  (1.676 m)    Wt 215 lb (97.5 kg)    LMP 04/05/2018    SpO2 95%    BMI 34.70 kg/m      Physical Exam Vitals and nursing note reviewed.  Constitutional:      General: She is not in acute distress.    Appearance: Normal appearance. She is obese. She is not ill-appearing.  HENT:     Head: Normocephalic and atraumatic.     Right Ear: Tympanic membrane, ear canal and external ear normal.     Left Ear: Tympanic membrane, ear canal and external ear normal.     Nose:     Right Sinus: Maxillary sinus tenderness present.     Left Sinus: Maxillary sinus tenderness present.     Comments: Turbinates are erythematous/edematous    Mouth/Throat:     Mouth: Mucous membranes are moist.     Pharynx: Oropharynx is clear.     Comments: Moderate to significant amount of clear drainage of posterior oropharynx noted Eyes:     Extraocular Movements: Extraocular movements intact.     Conjunctiva/sclera: Conjunctivae normal.     Pupils: Pupils are equal, round, and reactive to light.  Cardiovascular:     Rate and Rhythm: Normal rate and regular rhythm.     Pulses: Normal  pulses.     Heart sounds: Normal heart sounds.  Pulmonary:     Effort: Pulmonary effort is normal.     Breath sounds: Normal breath sounds. No wheezing, rhonchi or rales.  Comments: Frequent nonproductive cough noted on exam Musculoskeletal:     Cervical back: Normal range of motion and neck supple.  Skin:    General: Skin is warm and dry.  Neurological:     General: No focal deficit present.     Mental Status: She is alert and oriented to person, place, and time.     UC Treatments / Results  Labs (all labs ordered are listed, but only abnormal results are displayed) Labs Reviewed - No data to display  EKG   Radiology No results found.  Procedures Procedures (including critical care time)  Medications Ordered in UC Medications - No data to display  Initial Impression / Assessment and Plan / UC Course  I have reviewed the triage vital signs and the nursing notes.  Pertinent labs & imaging results that were available during my care of the patient were reviewed by me and considered in my medical decision making (see chart for details).     MDM: 1.  Acute maxillary sinusitis, recurrence not specified-Rx'd Augmentin; 2.  Cough-Rx'd Prednisone Promethazine DM, and Tessalon Perles; 3.  Allergic rhinitis-Rx'd Allegra. Advised patient to take medication as directed with food to completion.  Advised patient may take 1 dose of Augmentin 1 dose of Allegra and Promethazine DM tonight prior to sleep.  Starting tomorrow morning advised patient to take prednisone and Allegra with first dose of Augmentin for the next 5 of 10 days.  Advised may use Allegra afterwards as needed for concurrent postnasal drainage/drip.  Advised patient may use Tessalon Perles daily or as needed for cough.  Advised patient may use Promethazine DM in early evening or prior to sleep due to sedative effects.  Instructed patient not to take Tessalon Perles and Promethazine DM together.  Encouraged patient to  increase daily water intake while taking these medications.  Patient discharged home, hemodynamically stable. Final Clinical Impressions(s) / UC Diagnoses   Final diagnoses:  Acute maxillary sinusitis, recurrence not specified  Cough, unspecified type  Allergic rhinitis, unspecified seasonality, unspecified trigger     Discharge Instructions      Advised patient to take medication as directed with food to completion.  Advised patient may take 1 dose of Augmentin 1 dose of Allegra and Promethazine DM tonight prior to sleep.  Starting tomorrow morning advised patient to take Prednisone and Allegra with first dose of Augmentin for the next 5 of 10 days.  Advised may use Allegra afterwards as needed for concurrent postnasal drainage/drip.  Advised patient may use Tessalon Perles daily or as needed for cough.  Advised patient may use Promethazine DM in early evening or prior to sleep due to sedative effects.  Instructed patient not to take Tessalon Perles and Promethazine DM together.  Encouraged patient to increase daily water intake while taking these medications.     ED Prescriptions     Medication Sig Dispense Auth. Provider   amoxicillin-clavulanate (AUGMENTIN) 875-125 MG tablet Take 1 tablet by mouth 2 (two) times daily for 10 days. 20 tablet Trevor Iha, FNP   predniSONE (DELTASONE) 20 MG tablet Take 3 tabs PO daily x 5 days. 15 tablet Trevor Iha, FNP   benzonatate (TESSALON) 200 MG capsule Take 1 capsule (200 mg total) by mouth 3 (three) times daily as needed for up to 7 days for cough. 40 capsule Trevor Iha, FNP   promethazine-dextromethorphan (PROMETHAZINE-DM) 6.25-15 MG/5ML syrup Take 5 mLs by mouth 2 (two) times daily as needed for cough. 118 mL Trevor Iha, FNP  fexofenadine (ALLEGRA ALLERGY) 180 MG tablet Take 1 tablet (180 mg total) by mouth daily for 15 days. 15 tablet Eliezer Lofts, FNP      PDMP not reviewed this encounter.   Eliezer Lofts, [place] 06/01/21  Bosie Helper

## 2021-06-01 NOTE — ED Triage Notes (Signed)
Pt presents to Urgent Care with c/o cough and nasal congestion w/ sinus and ear pressure x 6 days. Reports 2 negative home COVID tests.

## 2021-06-01 NOTE — Discharge Instructions (Addendum)
Advised patient to take medication as directed with food to completion.  Advised patient may take 1 dose of Augmentin 1 dose of Allegra and Promethazine DM tonight prior to sleep.  Starting tomorrow morning advised patient to take Prednisone and Allegra with first dose of Augmentin for the next 5 of 10 days.  Advised may use Allegra afterwards as needed for concurrent postnasal drainage/drip.  Advised patient may use Tessalon Perles daily or as needed for cough.  Advised patient may use Promethazine DM in early evening or prior to sleep due to sedative effects.  Instructed patient not to take Tessalon Perles and Promethazine DM together.  Encouraged patient to increase daily water intake while taking these medications.

## 2021-06-26 ENCOUNTER — Other Ambulatory Visit: Payer: Self-pay | Admitting: Physician Assistant

## 2021-06-26 DIAGNOSIS — E781 Pure hyperglyceridemia: Secondary | ICD-10-CM

## 2021-06-27 ENCOUNTER — Other Ambulatory Visit: Payer: Self-pay | Admitting: Physician Assistant

## 2021-06-27 DIAGNOSIS — I10 Essential (primary) hypertension: Secondary | ICD-10-CM

## 2021-07-20 ENCOUNTER — Other Ambulatory Visit: Payer: Self-pay | Admitting: Physician Assistant

## 2021-07-20 DIAGNOSIS — E781 Pure hyperglyceridemia: Secondary | ICD-10-CM

## 2021-07-23 LAB — HM MAMMOGRAPHY

## 2021-07-23 LAB — HM PAP SMEAR: HM Pap smear: NEGATIVE

## 2021-07-29 ENCOUNTER — Other Ambulatory Visit: Payer: Self-pay | Admitting: Physician Assistant

## 2021-07-29 DIAGNOSIS — E781 Pure hyperglyceridemia: Secondary | ICD-10-CM

## 2021-08-11 ENCOUNTER — Encounter: Payer: Self-pay | Admitting: Emergency Medicine

## 2021-08-11 ENCOUNTER — Emergency Department (INDEPENDENT_AMBULATORY_CARE_PROVIDER_SITE_OTHER)
Admission: EM | Admit: 2021-08-11 | Discharge: 2021-08-11 | Disposition: A | Discharge status: Home / Self Care | Payer: No Typology Code available for payment source | Source: Home / Self Care

## 2021-08-11 DIAGNOSIS — J019 Acute sinusitis, unspecified: Secondary | ICD-10-CM | POA: Diagnosis not present

## 2021-08-11 DIAGNOSIS — B9689 Other specified bacterial agents as the cause of diseases classified elsewhere: Secondary | ICD-10-CM

## 2021-08-11 DIAGNOSIS — R059 Cough, unspecified: Secondary | ICD-10-CM | POA: Diagnosis not present

## 2021-08-11 DIAGNOSIS — J309 Allergic rhinitis, unspecified: Secondary | ICD-10-CM

## 2021-08-11 MED ORDER — BENZONATATE 200 MG PO CAPS: 200.0000 mg | ORAL_CAPSULE | Freq: Three times a day (TID) | ORAL | 0 refills | Status: AC | PRN

## 2021-08-11 MED ORDER — AMOXICILLIN-POT CLAVULANATE 875-125 MG PO TABS: 1.0000 | ORAL_TABLET | Freq: Two times a day (BID) | ORAL | 0 refills | Status: AC

## 2021-08-11 MED ORDER — PROMETHAZINE-DM 6.25-15 MG/5ML PO SYRP: 5.0000 mL | ORAL_SOLUTION | Freq: Two times a day (BID) | ORAL | 0 refills | Status: DC | PRN

## 2021-08-11 MED ORDER — PREDNISONE 20 MG PO TABS: ORAL_TABLET | ORAL | 0 refills | Status: DC

## 2021-08-11 MED ORDER — FEXOFENADINE HCL 180 MG PO TABS: 180.0000 mg | ORAL_TABLET | Freq: Every day | ORAL | 0 refills | Status: DC

## 2021-08-11 NOTE — ED Provider Notes (Signed)
?[facility] ? ? ? ?CSN: [id number] ?Arrival date & time: 08/11/21  2585 ? ? ?  ? ?History   ?Chief Complaint ?Chief Complaint  ?Patient presents with  ? Nasal Congestion  ? ? ?HPI ?Darlene Melendez is a 51 y.o. female.  ? ?HPI 51 year old female presents with sinus nasal congestion for 1 week.  PMH significant for obesity, HTN, and migraines. ? ?Past Medical History:  ?Diagnosis Date  ? Anxiety   ? Hypertension   ? Migraines   ? Pneumonia   ? ? ?Patient Active Problem List  ? Diagnosis Date Noted  ? Post-menopausal 06/23/2020  ? Serum calcium elevated 06/23/2020  ? Thyroid disorder screen 06/23/2020  ? Elevated fasting glucose 06/23/2020  ? Acute non intractable tension-type headache 06/23/2020  ? Accessory skin tags 09/07/2017  ? Hypertriglyceridemia 04/07/2016  ? Skin lesion 03/22/2016  ? Knee crepitus 03/22/2016  ? Primary osteoarthritis of knees, bilateral 03/22/2016  ? Elevated liver enzymes 09/02/2015  ? Hypokalemia 09/01/2015  ? Cephalalgia 09/01/2015  ? Hyperlipidemia 08/16/2014  ? Obesity 08/13/2014  ? GALLSTONES 08/13/2009  ? ROTATOR CUFF SYNDROME, RIGHT 05/27/2008  ? NECK STIFFNESS 06/09/2007  ? Migraine without aura 03/31/2007  ? ESSENTIAL HYPERTENSION, BENIGN 03/31/2007  ? Weight gain 03/31/2007  ? ? ?Past Surgical History:  ?Procedure Laterality Date  ? CHOLECYSTECTOMY  2011  ? ? ?OB History   ?No obstetric history on file. ?  ? ? ? ?Home Medications   ? ?Prior to Admission medications   ?Medication Sig Start Date End Date Taking? Authorizing Provider  ?amoxicillin-clavulanate (AUGMENTIN) 875-125 MG tablet Take 1 tablet by mouth 2 (two) times daily for 10 days. 08/11/21 08/21/21 Yes Trevor Iha, FNP  ?benzonatate (TESSALON) 200 MG capsule Take 1 capsule (200 mg total) by mouth 3 (three) times daily as needed for up to 7 days for cough. 08/11/21 08/18/21 Yes Trevor Iha, FNP  ?fexofenadine (ALLEGRA ALLERGY) 180 MG tablet Take 1 tablet (180 mg total) by mouth daily for 15 days. 08/11/21  08/26/21 Yes Trevor Iha, FNP  ?predniSONE (DELTASONE) 20 MG tablet Take 3 tabs PO daily x 5 days. 08/11/21  Yes Trevor Iha, FNP  ?promethazine-dextromethorphan (PROMETHAZINE-DM) 6.25-15 MG/5ML syrup Take 5 mLs by mouth 2 (two) times daily as needed for cough. 08/11/21  Yes Trevor Iha, FNP  ?baclofen (LIORESAL) 10 MG tablet TAKE 1 TABLET BY MOUTH EVERYDAY AT BEDTIME ?Patient not taking: Reported on 08/11/2021 07/14/20   Jomarie Longs, PA-C  ?cholecalciferol (VITAMIN D3) 25 MCG (1000 UT) tablet Take 1,000 Units by mouth daily.    [provider]  ?citalopram (CELEXA) 20 MG tablet Take 20 mg by mouth daily.    [provider]  ?enalapril-hydrochlorothiazide (VASERETIC) 10-25 MG tablet Take 1 tablet by mouth daily. APPT FOR REFILLS 06/29/21   Tandy Gaw L, PA-C  ?fenofibrate (TRICOR) 145 MG tablet TAKE 1 TABLET BY MOUTH EVERY DAY/ APPT FOR REFILLS 07/20/21   Tandy Gaw L, PA-C  ?ibuprofen (ADVIL,MOTRIN) 800 MG tablet Take 1 tablet (800 mg total) by mouth every 8 (eight) hours as needed. ?Patient not taking: Reported on 08/11/2021 10/12/16   Tandy Gaw L, PA-C  ?ipratropium (ATROVENT) 0.03 % nasal spray Place 2 sprays into both nostrils every 12 (twelve) hours.    [provider]  ?KRILL OIL PO Take by mouth daily.    [provider]  ?ondansetron (ZOFRAN-ODT) 8 MG disintegrating tablet Take 1 tablet (8 mg total) by mouth every 8 (eight) hours as needed for nausea.  06/20/20   Breeback, Jade L, PA-C  ?rizatriptan (MAXALT) 10 MG tablet TAKE 1 TABLET BY MOUTH AS NEEDED FOR MIGRAINE. MAY REPEAT IN 2 HOURS IF NEEDED 07/14/20   Tandy Gaw L, PA-C  ? ? ?Family History ?Family History  ?Problem Relation Age of Onset  ? Hypertension Mother   ? Hypertension Father   ? Diabetes Father   ? Cancer Father   ? ? ?Social History ?Social History  ? ?Tobacco Use  ? Smoking status: Never  ? Smokeless tobacco: Never  ?Vaping Use  ? Vaping Use: Never used  ?Substance Use Topics  ? Alcohol  use: Yes  ?  Alcohol/week: 4.0 standard drinks  ?  Types: 4 Standard drinks or equivalent per week  ? Drug use: No  ? ? ? ?Allergies   ?Imitrex [sumatriptan] ? ? ?Review of Systems ?Review of Systems  ?HENT:  Positive for congestion.   ?Respiratory:  Positive for cough.   ?All other systems reviewed and are negative. ? ? ?Physical Exam ?Triage Vital Signs ?ED Triage Vitals  ?Enc Vitals Group  ?   BP 08/11/21 0858 118/80  ?   Pulse Rate 08/11/21 0858 77  ?   Resp 08/11/21 0858 17  ?   Temp 08/11/21 0858 99.1 ?F (37.3 ?C)  ?   Temp Source 08/11/21 0858 Oral  ?   SpO2 08/11/21 0858 96 %  ?   Weight 08/11/21 0859 209 lb (94.8 kg)  ?   Height 08/11/21 0859 5\' 6"  (1.676 m)  ?   Head Circumference --   ?   Peak Flow --   ?   Pain Score 08/11/21 0859 4  ?   Pain Loc --   ?   Pain Edu? --   ?   Excl. in GC? --   ? ?No data found. ? ?Updated Vital Signs ?BP 118/80 (BP Location: Left Arm)   Pulse 77   Temp 99.1 ?F (37.3 ?C) (Oral)   Resp 17   Ht 5\' 6"  (1.676 m)   Wt 209 lb (94.8 kg)   LMP 04/05/2018   SpO2 96%   BMI 33.73 kg/m?  ? ? ?Physical Exam ?Vitals and nursing note reviewed.  ?Constitutional:   ?   General: She is not in acute distress. ?   Appearance: Normal appearance. She is obese. She is not ill-appearing.  ?HENT:  ?   Head: Normocephalic and atraumatic.  ?   Right Ear: Tympanic membrane and external ear normal.  ?   Left Ear: Tympanic membrane and external ear normal.  ?   Ears:  ?   Comments: Significant eustachian tube dysfunction noted bilaterally ?   Nose:  ?   Right Sinus: Maxillary sinus tenderness present.  ?   Left Sinus: Maxillary sinus tenderness present.  ?   Comments: Turbinates are erythematous/edematous ?   Mouth/Throat:  ?   Mouth: Mucous membranes are moist.  ?   Pharynx: Oropharynx is clear. Uvula midline.  ?   Comments: Moderate to significant amount of clear drainage of posterior oropharynx noted ?Eyes:  ?   Extraocular Movements: Extraocular movements intact.  ?   Conjunctiva/sclera:  Conjunctivae normal.  ?   Pupils: Pupils are equal, round, and reactive to light.  ?Cardiovascular:  ?   Rate and Rhythm: Normal rate and regular rhythm.  ?   Pulses: Normal pulses.  ?   Heart sounds: Normal heart sounds.  ?Pulmonary:  ?   Effort: Pulmonary effort is normal.  ?  Breath sounds: Normal breath sounds. No wheezing, rhonchi or rales.  ?   Comments: Infrequent nonproductive cough noted on exam ?Musculoskeletal:  ?   Cervical back: Normal range of motion and neck supple.  ?Skin: ?   General: Skin is warm and dry.  ?Neurological:  ?   General: No focal deficit present.  ?   Mental Status: She is alert and oriented to person, place, and time.  ? ? ? ?UC Treatments / Results  ?Labs ?(all labs ordered are listed, but only abnormal results are displayed) ?Labs Reviewed - No data to display ? ?EKG ? ? ?Radiology ?No results found. ? ?Procedures ?Procedures (including critical care time) ? ?Medications Ordered in UC ?Medications - No data to display ? ?Initial Impression / Assessment and Plan / UC Course  ?I have reviewed the triage vital signs and the nursing notes. ? ?Pertinent labs & imaging results that were available during my care of the patient were reviewed by me and considered in my medical decision making (see chart for details). ? ?  ? ?MDM: 1.  Acute bacterial rhinosinusitis-Rx'd Augmentin; 2.  Allergic rhinitis-Rx'd Allegra; 3.  Cough-Rx'd Prednisone, Tessalon Perles, Promethazine DM. Advised patient to take medication as directed with food to completion.  Instructed patient to take Prednisone and Allegra with first dose of Augmentin for the next 5 of 10 days.  Advised may use Tessalon Perles daily or as needed for cough.  Advised may use Promethazine DM at night prior to sleep.  Encouraged patient to increase daily water intake while taking these medications.  Advised patient if symptoms worsen and/or unresolved please follow-up with PCP or here for further evaluation.  Patient discharged home,  hemodynamically stable. ? ? ?Final Clinical Impressions(s) / UC Diagnoses  ? ?Final diagnoses:  ?Acute bacterial rhinosinusitis  ?Allergic rhinitis, unspecified seasonality, unspecified trigger  ?Cough, unspecified ty

## 2021-08-11 NOTE — ED Triage Notes (Signed)
Sinus congestion x 1 week  ?Cough since Thursday  ?OTC  meds - flonase ?Used leftover prescription nasal decongestant & cough medicine  ?Temp 101 last night  ?

## 2021-08-11 NOTE — Discharge Instructions (Addendum)
Advised patient to take medication as directed with food to completion.  Instructed patient to take Prednisone and Allegra with first dose of Augmentin for the next 5 of 10 days.  Advised may use Tessalon Perles daily or as needed for cough.  Advised may use Promethazine DM at night prior to sleep.  Encouraged patient to increase daily water intake while taking these medications.  Advised patient if symptoms worsen and/or unresolved please follow-up with PCP or here for further evaluation. ?

## 2021-08-19 ENCOUNTER — Encounter: Payer: Self-pay | Admitting: Physician Assistant

## 2021-08-19 ENCOUNTER — Ambulatory Visit (INDEPENDENT_AMBULATORY_CARE_PROVIDER_SITE_OTHER): Payer: No Typology Code available for payment source | Admitting: Physician Assistant

## 2021-08-19 VITALS — BP 138/62 | HR 73 | Ht 66.0 in | Wt 208.0 lb

## 2021-08-19 DIAGNOSIS — Z1329 Encounter for screening for other suspected endocrine disorder: Secondary | ICD-10-CM | POA: Diagnosis not present

## 2021-08-19 DIAGNOSIS — E781 Pure hyperglyceridemia: Secondary | ICD-10-CM

## 2021-08-19 DIAGNOSIS — R7301 Impaired fasting glucose: Secondary | ICD-10-CM

## 2021-08-19 DIAGNOSIS — Q828 Other specified congenital malformations of skin: Secondary | ICD-10-CM

## 2021-08-19 DIAGNOSIS — I10 Essential (primary) hypertension: Secondary | ICD-10-CM | POA: Diagnosis not present

## 2021-08-19 DIAGNOSIS — R748 Abnormal levels of other serum enzymes: Secondary | ICD-10-CM

## 2021-08-19 DIAGNOSIS — Z1159 Encounter for screening for other viral diseases: Secondary | ICD-10-CM

## 2021-08-19 DIAGNOSIS — Z79899 Other long term (current) drug therapy: Secondary | ICD-10-CM

## 2021-08-19 MED ORDER — FENOFIBRATE 145 MG PO TABS: 145.0000 mg | ORAL_TABLET | Freq: Every day | ORAL | 3 refills | Status: DC

## 2021-08-19 MED ORDER — ENALAPRIL-HYDROCHLOROTHIAZIDE 10-25 MG PO TABS: 1.0000 | ORAL_TABLET | Freq: Every day | ORAL | 3 refills | Status: DC

## 2021-08-19 NOTE — Progress Notes (Signed)
? ?Established Patient Office Visit ? ?Subjective   ?Patient ID: Darlene Melendez, female    DOB: 1971/03/12  Age: 51 y.o. MRN: [id number] ? ?Chief Complaint  ?Patient presents with  ? Follow-up  ? ? ?HPI ?Pt is a 50 yo female who presents to the clinic for medication refills.  ? ?She is doing well. No CP, palpitations, headaches or vision changes. She is taking her medications. She does have some skin tags she would like removed.  ? ?.. ?Active Ambulatory Problems  ?  Diagnosis Date Noted  ? Migraine without aura 03/31/2007  ? ESSENTIAL HYPERTENSION, BENIGN 03/31/2007  ? GALLSTONES 08/13/2009  ? NECK STIFFNESS 06/09/2007  ? ROTATOR CUFF SYNDROME, RIGHT 05/27/2008  ? Weight gain 03/31/2007  ? Obesity 08/13/2014  ? Hyperlipidemia 08/16/2014  ? Hypokalemia 09/01/2015  ? Cephalalgia 09/01/2015  ? Elevated liver enzymes 09/02/2015  ? Skin lesion 03/22/2016  ? Knee crepitus 03/22/2016  ? Primary osteoarthritis of knees, bilateral 03/22/2016  ? Hypertriglyceridemia 04/07/2016  ? Accessory skin tags 09/07/2017  ? Post-menopausal 06/23/2020  ? Serum calcium elevated 06/23/2020  ? Thyroid disorder screen 06/23/2020  ? Elevated fasting glucose 06/23/2020  ? Acute non intractable tension-type headache 06/23/2020  ? ?Resolved Ambulatory Problems  ?  Diagnosis Date Noted  ? Acute sinusitis, unspecified 04/04/2009  ? RUQ PAIN 08/06/2009  ? VIRAL URI 05/06/2010  ? Skin macule 01/01/2015  ? CAP (community acquired pneumonia) 09/01/2015  ? Obesity (BMI 35.0-39.9 without comorbidity) 10/12/2016  ? Influenza-like illness 05/30/2017  ? Acute non-recurrent maxillary sinusitis 04/03/2018  ? ?Past Medical History:  ?Diagnosis Date  ? Anxiety   ? Hypertension   ? Migraines   ? Pneumonia   ? ? ? ?Review of Systems  ?All other systems reviewed and are negative. ? ?  ?Objective:  ?  ? ?BP 138/62   Pulse 73   Ht 5\' 6"  (1.676 m)   Wt 208 lb (94.3 kg)   LMP 04/05/2018   SpO2 98%   BMI 33.57 kg/m?  ?BP Readings from Last 3 Encounters:   ?08/19/21 138/62  ?08/11/21 118/80  ?06/01/21 121/79  ? ?  ? ?Physical Exam ?Vitals reviewed.  ?Constitutional:   ?   Appearance: Normal appearance. She is obese.  ?HENT:  ?   Head: Normocephalic.  ?Cardiovascular:  ?   Rate and Rhythm: Normal rate and regular rhythm.  ?   Pulses: Normal pulses.  ?   Heart sounds: Normal heart sounds.  ?Pulmonary:  ?   Effort: Pulmonary effort is normal.  ?   Breath sounds: Normal breath sounds.  ?Skin: ?   Comments: Axilla skin tags, bilateral.   ?Neurological:  ?   General: No focal deficit present.  ?   Mental Status: She is alert and oriented to person, place, and time.  ?Psychiatric:     ?   Mood and Affect: Mood normal.  ? ?.. ? ?  09/17/2019  ?  3:05 PM 02/09/2019  ?  8:50 AM 09/07/2017  ? 11:26 AM  ?Depression screen PHQ 2/9  ?Decreased Interest 0 0 0  ?Down, Depressed, Hopeless 0 0 0  ?PHQ - 2 Score 0 0 0  ?Altered sleeping 0 0   ?Tired, decreased energy 1 1   ?Change in appetite 0 0   ?Feeling bad or failure about yourself  0 0   ?Trouble concentrating 0 0   ?Moving slowly or fidgety/restless 0 0   ?Suicidal thoughts 0 0   ?PHQ-9 Score 1 1   ?  Difficult doing work/chores Not difficult at all Not difficult at all   ? ?Skin Tag Removal Procedure Note ?Diagnosis: inflamed skin tags ?Location: axillae and bilateral axilla ?Informed Consent: Discussed risks (permanent scarring, infection, pain, bleeding, bruising, redness, and recurrence of the lesion) and benefits of the procedure, as well as the alternatives. She is aware that skin tags are benign lesions, and their removal is often not considered medically necessary. Informed consent was obtained. ?Preparation: The area was prepared in a standard fashion. ?Anesthesia: not required ?Procedure Details: Iris scissors were used to perform sharp removal. Aluminum chloride was applied for hemostasis. Ointment and bandage were applied where needed. The patient tolerated the procedure well. ?Total number of lesions treated: 3 ?Plan:  The patient was instructed on post-op care. Recommend OTC analgesia as needed for pain. ? ? ?  ?Assessment & Plan:  ?..[name] was seen today for follow-up. ? ?Diagnoses and all orders for this visit: ? ?Hypertriglyceridemia ?-     Lipid Panel w/reflex Direct LDL ?-     fenofibrate (TRICOR) 145 MG tablet; Take 1 tablet (145 mg total) by mouth daily. ? ?Elevated liver enzymes ?-     COMPLETE METABOLIC PANEL WITH GFR ? ?Essential hypertension, benign ?-     COMPLETE METABOLIC PANEL WITH GFR ?-     enalapril-hydrochlorothiazide (VASERETIC) 10-25 MG tablet; Take 1 tablet by mouth daily. ? ?Thyroid disorder screen ?-     TSH ? ?Elevated fasting glucose ?-     Hemoglobin A1c ? ?Medication management ?-     TSH ?-     Lipid Panel w/reflex Direct LDL ?-     COMPLETE METABOLIC PANEL WITH GFR ?-     CBC with Differential/Platelet ? ?Encounter for hepatitis C screening test for low risk patient ?-     Hepatitis C Antibody ? ?Accessory skin tags ? ? ?BP looks great.  ?Labs ordered today ?Medications refilled ?Skin tags removed.  ?Discussed shingles vaccine. Pt declined today.  ? ?Return in about 6 months (around 02/18/2022) for F/U HTN.  ? ? ?Tandy Gaw, PA-C ? ?

## 2021-09-04 ENCOUNTER — Encounter: Payer: Self-pay | Admitting: Physician Assistant

## 2021-09-04 NOTE — Progress Notes (Signed)
Negative for intraepithelial lesion or malignancy.  

## 2022-05-25 LAB — COMPREHENSIVE METABOLIC PANEL: eGFR: 76

## 2022-05-25 LAB — LIPID PANEL
Cholesterol: 180 (ref 0–200)
HDL: 69 (ref 35–70)
LDL Cholesterol: 98
Triglycerides: 115 (ref 40–160)

## 2022-05-25 LAB — BASIC METABOLIC PANEL
BUN: 16 (ref 4–21)
Creatinine: 0.9 (ref 0.5–1.1)

## 2022-05-25 LAB — HEMOGLOBIN A1C: Hemoglobin A1C: 6.4

## 2022-05-25 LAB — TSH: TSH: 1.57 (ref 0.41–5.90)

## 2022-06-17 ENCOUNTER — Other Ambulatory Visit: Payer: Self-pay | Admitting: Neurology

## 2022-06-17 MED ORDER — ENALAPRIL MALEATE 10 MG PO TABS: 10.0000 mg | ORAL_TABLET | Freq: Every day | ORAL | 0 refills | Status: DC

## 2022-06-17 MED ORDER — HYDROCHLOROTHIAZIDE 25 MG PO TABS: 25.0000 mg | ORAL_TABLET | Freq: Every day | ORAL | 0 refills | Status: DC

## 2022-06-17 NOTE — Telephone Encounter (Signed)
[facility] called stating they have been out of stock through supplier for patient's enalapril-HCTZ for one time and are asking for separate prescriptions. RX pended. Please sign if appropriate. She is overdue for a visit. Pended for one month with note.

## 2022-06-18 ENCOUNTER — Ambulatory Visit
Admission: RE | Admit: 2022-06-18 | Discharge: 2022-06-18 | Disposition: A | Discharge status: Home / Self Care | Payer: No Typology Code available for payment source | Source: Ambulatory Visit | Attending: Urgent Care

## 2022-06-18 VITALS — BP 140/84 | HR 66 | Temp 97.9°F | Resp 16 | Ht 66.0 in | Wt 213.0 lb

## 2022-06-18 DIAGNOSIS — J310 Chronic rhinitis: Secondary | ICD-10-CM | POA: Diagnosis not present

## 2022-06-18 DIAGNOSIS — T485X5A Adverse effect of other anti-common-cold drugs, initial encounter: Secondary | ICD-10-CM | POA: Diagnosis not present

## 2022-06-18 MED ORDER — METHYLPREDNISOLONE ACETATE 80 MG/ML IJ SUSP
80.0000 mg | Freq: Once | INTRAMUSCULAR | Status: AC
  Administered 2022-06-18: 80 mg via INTRAMUSCULAR

## 2022-06-18 MED ORDER — PREDNISONE 20 MG PO TABS: 40.0000 mg | ORAL_TABLET | Freq: Every day | ORAL | 0 refills | Status: DC

## 2022-06-18 NOTE — ED Provider Notes (Signed)
[facility]  Note:  This document was prepared using Systems analyst and may include unintentional dictation errors.  MRN: [id number] DOB: [birth year]  Subjective:   Darlene Melendez is a 52 y.o. female presenting for 1 day history of acute on chronic sinus congestion, sinus drainage, sinus pressure.  Reports that she is not able to breathe through her nose at all.  Uses an over-the-counter allergy medication daily.  Uses Afrin 1-2 times daily.  Has a history of chronic rhinitis, allergic rhinitis, recurrent sinus infections, pneumonia.  No cough, chest pain, shortness of breath or wheezing.  No fever.  No current facility-administered medications for this encounter.  Current Outpatient Medications:    cholecalciferol (VITAMIN D3) 25 MCG (1000 UT) tablet, Take 1,000 Units by mouth daily., Disp: , Rfl:    citalopram (CELEXA) 20 MG tablet, Take 20 mg by mouth daily., Disp: , Rfl:    enalapril (VASOTEC) 10 MG tablet, Take 1 tablet (10 mg total) by mouth daily. Appt for refills, Disp: 30 tablet, Rfl: 0   enalapril-hydrochlorothiazide (VASERETIC) 10-25 MG tablet, Take 1 tablet by mouth daily. (Patient not taking: Reported on 06/18/2022), Disp: 90 tablet, Rfl: 3   fenofibrate (TRICOR) 145 MG tablet, Take 1 tablet (145 mg total) by mouth daily., Disp: 90 tablet, Rfl: 3   fexofenadine (ALLEGRA ALLERGY) 180 MG tablet, Take 1 tablet (180 mg total) by mouth daily for 15 days., Disp: 15 tablet, Rfl: 0   hydrochlorothiazide (HYDRODIURIL) 25 MG tablet, Take 1 tablet (25 mg total) by mouth daily. Appt for refills, Disp: 30 tablet, Rfl: 0   ibuprofen (ADVIL,MOTRIN) 800 MG tablet, Take 1 tablet (800 mg total) by mouth every 8 (eight) hours as needed. (Patient not taking: Reported on 06/18/2022), Disp: 60 tablet, Rfl: 2   ipratropium (ATROVENT) 0.03 % nasal spray, Place 2 sprays into both nostrils every 12 (twelve) hours., Disp: , Rfl:    KRILL OIL PO, Take by mouth  daily., Disp: , Rfl:    ondansetron (ZOFRAN-ODT) 8 MG disintegrating tablet, Take 1 tablet (8 mg total) by mouth every 8 (eight) hours as needed for nausea. (Patient not taking: Reported on 06/18/2022), Disp: 20 tablet, Rfl: 1   promethazine-dextromethorphan (PROMETHAZINE-DM) 6.25-15 MG/5ML syrup, Take 5 mLs by mouth 2 (two) times daily as needed for cough. (Patient not taking: Reported on 06/18/2022), Disp: 118 mL, Rfl: 0   rizatriptan (MAXALT) 10 MG tablet, TAKE 1 TABLET BY MOUTH AS NEEDED FOR MIGRAINE. MAY REPEAT IN 2 HOURS IF NEEDED, Disp: 18 tablet, Rfl: 1   Allergies  Allergen Reactions   Imitrex [Sumatriptan] Nausea Only    Nauseated.    Past Medical History:  Diagnosis Date   Anxiety    Hypertension    Migraines    Pneumonia      Past Surgical History:  Procedure Laterality Date   CHOLECYSTECTOMY  2011    Family History  Problem Relation Age of Onset   Hypertension Mother    Hypertension Father    Diabetes Father    Cancer Father     Social History   Tobacco Use   Smoking status: Never   Smokeless tobacco: Never  Vaping Use   Vaping Use: Never used  Substance Use Topics   Alcohol use: Yes    Alcohol/week: 4.0 standard drinks of alcohol    Types: 4 Standard drinks or equivalent per week   Drug use: No    ROS   Objective:   Vitals: BP Marland Kitchen)  140/84 (BP Location: Left Arm)   Pulse 66   Temp 97.9 F (36.6 C) (Oral)   Resp 16   Ht 5' 6"$  (1.676 m)   Wt 213 lb (96.6 kg)   LMP 04/05/2018   SpO2 98%   BMI 34.38 kg/m   Physical Exam Constitutional:      General: She is not in acute distress.    Appearance: Normal appearance. She is well-developed and normal weight. She is not ill-appearing, toxic-appearing or diaphoretic.  HENT:     Head: Normocephalic and atraumatic.     Right Ear: Tympanic membrane, ear canal and external ear normal. No drainage or tenderness. No middle ear effusion. There is no impacted cerumen. Tympanic membrane is not erythematous  or bulging.     Left Ear: Tympanic membrane, ear canal and external ear normal. No drainage or tenderness.  No middle ear effusion. There is no impacted cerumen. Tympanic membrane is not erythematous or bulging.     Nose: Congestion and rhinorrhea present.     Right Nostril: No foreign body, epistaxis, septal hematoma or occlusion.     Left Nostril: No foreign body, epistaxis, septal hematoma or occlusion.     Right Turbinates: Enlarged and swollen.     Left Turbinates: Enlarged and swollen.     Mouth/Throat:     Mouth: Mucous membranes are moist. No oral lesions.     Pharynx: No pharyngeal swelling, oropharyngeal exudate, posterior oropharyngeal erythema or uvula swelling.     Tonsils: No tonsillar exudate or tonsillar abscesses.  Eyes:     General: No scleral icterus.       Right eye: No discharge.        Left eye: No discharge.     Extraocular Movements: Extraocular movements intact.     Right eye: Normal extraocular motion.     Left eye: Normal extraocular motion.     Conjunctiva/sclera: Conjunctivae normal.  Cardiovascular:     Rate and Rhythm: Normal rate and regular rhythm.     Heart sounds: Normal heart sounds. No murmur heard.    No friction rub. No gallop.  Pulmonary:     Effort: Pulmonary effort is normal. No respiratory distress.     Breath sounds: No stridor. No wheezing, rhonchi or rales.  Chest:     Chest wall: No tenderness.  Musculoskeletal:     Cervical back: Normal range of motion and neck supple.  Lymphadenopathy:     Cervical: No cervical adenopathy.  Skin:    General: Skin is warm and dry.  Neurological:     General: No focal deficit present.     Mental Status: She is alert and oriented to person, place, and time.  Psychiatric:        Mood and Affect: Mood normal.        Behavior: Behavior normal.    IM Depo-Medrol in clinic at 80 mg.  Assessment and Plan :   PDMP not reviewed this encounter.  1. Chronic rhinitis   2. Rhinitis medicamentosa     Will defer antibiotic use for now.  IM steroid as above.  Start prednisone in 3 days.  Maintain allergy medication.  Discontinue Afrin use going forward.  Use pseudoephedrine as needed in its place.  I placed a referral to an allergy clinic for consultation regarding her chronic rhinitis, allergic rhinitis. Counseled patient on potential for adverse effects with medications prescribed/recommended today, ER and return-to-clinic precautions discussed, patient verbalized understanding.     Jaynee Eagles, PA-C 06/18/22  1314  

## 2022-06-18 NOTE — ED Triage Notes (Addendum)
Nasal congestion since 0200 this am  Pt has  chronic sinus issues  OTC nasonex, sudafed - no relief  Unable to breath out of her nose   Facial pressure  Teeth hurt

## 2022-06-18 NOTE — Discharge Instructions (Addendum)
Stop using Afrin. Follow up with the allergy clinic. Start the prednisone course on Sunday. Continue taking your over-the-counter allergy medication. Once you finish all the steroids you can use pseudoephedrine as needed.

## 2022-06-23 ENCOUNTER — Ambulatory Visit (INDEPENDENT_AMBULATORY_CARE_PROVIDER_SITE_OTHER): Payer: No Typology Code available for payment source | Admitting: Physician Assistant

## 2022-06-23 ENCOUNTER — Encounter: Payer: Self-pay | Admitting: Physician Assistant

## 2022-06-23 VITALS — BP 142/71 | HR 66 | Ht 66.0 in | Wt 211.0 lb

## 2022-06-23 DIAGNOSIS — R7301 Impaired fasting glucose: Secondary | ICD-10-CM

## 2022-06-23 DIAGNOSIS — E6609 Other obesity due to excess calories: Secondary | ICD-10-CM

## 2022-06-23 DIAGNOSIS — G43009 Migraine without aura, not intractable, without status migrainosus: Secondary | ICD-10-CM

## 2022-06-23 DIAGNOSIS — E66811 Obesity, class 1: Secondary | ICD-10-CM

## 2022-06-23 DIAGNOSIS — I10 Essential (primary) hypertension: Secondary | ICD-10-CM

## 2022-06-23 DIAGNOSIS — R7303 Prediabetes: Secondary | ICD-10-CM

## 2022-06-23 DIAGNOSIS — Z1329 Encounter for screening for other suspected endocrine disorder: Secondary | ICD-10-CM

## 2022-06-23 DIAGNOSIS — Z6834 Body mass index (BMI) 34.0-34.9, adult: Secondary | ICD-10-CM

## 2022-06-23 DIAGNOSIS — E781 Pure hyperglyceridemia: Secondary | ICD-10-CM

## 2022-06-23 DIAGNOSIS — Z1159 Encounter for screening for other viral diseases: Secondary | ICD-10-CM

## 2022-06-23 DIAGNOSIS — Z79899 Other long term (current) drug therapy: Secondary | ICD-10-CM

## 2022-06-23 MED ORDER — ENALAPRIL MALEATE 10 MG PO TABS: 10.0000 mg | ORAL_TABLET | Freq: Every day | ORAL | 3 refills | Status: DC

## 2022-06-23 MED ORDER — RIZATRIPTAN BENZOATE 10 MG PO TABS: ORAL_TABLET | ORAL | 1 refills | Status: AC

## 2022-06-23 MED ORDER — HYDROCHLOROTHIAZIDE 25 MG PO TABS: 25.0000 mg | ORAL_TABLET | Freq: Every day | ORAL | 3 refills | Status: DC

## 2022-06-23 MED ORDER — FENOFIBRATE 145 MG PO TABS: 145.0000 mg | ORAL_TABLET | Freq: Every day | ORAL | 3 refills | Status: DC

## 2022-06-23 NOTE — Patient Instructions (Signed)

## 2022-06-23 NOTE — Progress Notes (Signed)
Established Patient Office Visit  Subjective   Patient ID: Darlene Melendez, female    DOB: Nov 20, 1970  Age: 52 y.o. MRN: [id number]  Chief Complaint  Patient presents with   Medication Refill    HPI Darlene Melendez is a 52 yo obese female who presents to the clinic for medication refills.   HTN- doing well. No concerns. Not checking BP. Declines CP, palpitations, headaches, or vision changes. She is taking her medication. She is on prednisone today for rhinitis.   She has 1-2 migraines a month and maxalt works well to rescue. No concerns.   She did have labs done and A1C was 6.4. she does want to lose weight and get healthier.   Patient Active Problem List   Diagnosis Date Noted   Pre-diabetes 06/23/2022   Post-menopausal 06/23/2020   Serum calcium elevated 06/23/2020   Thyroid disorder screen 06/23/2020   Elevated fasting glucose 06/23/2020   Acute non intractable tension-type headache 06/23/2020   Accessory skin tags 09/07/2017   Hypertriglyceridemia 04/07/2016   Skin lesion 03/22/2016   Knee crepitus 03/22/2016   Primary osteoarthritis of knees, bilateral 03/22/2016   Elevated liver enzymes 09/02/2015   Hypokalemia 09/01/2015   Cephalalgia 09/01/2015   Hyperlipidemia 08/16/2014   Obesity 08/13/2014   GALLSTONES 08/13/2009   ROTATOR CUFF SYNDROME, RIGHT 05/27/2008   NECK STIFFNESS 06/09/2007   Migraine without aura 03/31/2007   ESSENTIAL HYPERTENSION, BENIGN 03/31/2007   Weight gain 03/31/2007   Past Medical History:  Diagnosis Date   Anxiety    Hypertension    Migraines    Pneumonia    Past Surgical History:  Procedure Laterality Date   CHOLECYSTECTOMY  2011   Family History  Problem Relation Age of Onset   Hypertension Mother    Hypertension Father    Diabetes Father    Cancer Father    Allergies  Allergen Reactions   Imitrex [Sumatriptan] Nausea Only    Nauseated.      Review of Systems  All other systems reviewed and are negative.     Objective:      BP (!) 142/71 (BP Location: Left Arm, Patient Position: Sitting, Cuff Size: Normal)   Pulse 66   Ht '5\' 6"'$  (1.676 m)   Wt 211 lb (95.7 kg)   LMP 04/05/2018   SpO2 97%   BMI 34.06 kg/m  BP Readings from Last 3 Encounters:  06/23/22 (!) 142/71  06/18/22 (!) 140/84  08/19/21 138/62   Wt Readings from Last 3 Encounters:  06/23/22 211 lb (95.7 kg)  06/18/22 213 lb (96.6 kg)  08/19/21 208 lb (94.3 kg)      Physical Exam Constitutional:      Appearance: Normal appearance. She is obese.  HENT:     Head: Normocephalic.     Right Ear: Tympanic membrane, ear canal and external ear normal. There is no impacted cerumen.     Left Ear: Tympanic membrane, ear canal and external ear normal. There is no impacted cerumen.     Nose: Nose normal.     Mouth/Throat:     Mouth: Mucous membranes are moist.     Pharynx: No oropharyngeal exudate or posterior oropharyngeal erythema.  Eyes:     Extraocular Movements: Extraocular movements intact.     Conjunctiva/sclera: Conjunctivae normal.     Pupils: Pupils are equal, round, and reactive to light.  Neck:     Vascular: No carotid bruit.  Cardiovascular:     Rate and Rhythm: Normal rate and regular rhythm.  Pulses: Normal pulses.  Pulmonary:     Effort: Pulmonary effort is normal.     Breath sounds: Normal breath sounds.  Abdominal:     General: Bowel sounds are normal. There is no distension.     Palpations: Abdomen is soft. There is no mass.     Tenderness: There is no abdominal tenderness. There is no right CVA tenderness, left CVA tenderness, guarding or rebound.     Hernia: No hernia is present.  Musculoskeletal:     Cervical back: Normal range of motion and neck supple. No rigidity or tenderness.     Right lower leg: No edema.     Left lower leg: No edema.  Lymphadenopathy:     Cervical: No cervical adenopathy.  Neurological:     General: No focal deficit present.     Mental Status: She is alert and oriented to person,  place, and time.  Psychiatric:        Mood and Affect: Mood normal.      A1C from external source 6.4.  LDL 98 TSH 1.57  The 10-year ASCVD risk score (Arnett DK, et al., 2019) is: 2.5%* (Cholesterol units were assumed)    Assessment & Plan:  Marland KitchenMarland KitchenTabitha was seen today for medication refill.  Diagnoses and all orders for this visit:  Essential hypertension, benign -     COMPLETE METABOLIC PANEL WITH GFR -     enalapril (VASOTEC) 10 MG tablet; Take 1 tablet (10 mg total) by mouth daily. -     hydrochlorothiazide (HYDRODIURIL) 25 MG tablet; Take 1 tablet (25 mg total) by mouth daily.  Hypertriglyceridemia -     Lipid Panel w/reflex Direct LDL -     fenofibrate (TRICOR) 145 MG tablet; Take 1 tablet (145 mg total) by mouth daily.  Thyroid disorder screen -     TSH  Elevated fasting glucose -     Hemoglobin A1c  Medication management -     TSH -     Lipid Panel w/reflex Direct LDL -     COMPLETE METABOLIC PANEL WITH GFR -     CBC with Differential/Platelet -     Hemoglobin A1c  Encounter for hepatitis C screening test for low risk patient -     Hepatitis C Antibody  Pre-diabetes  Migraine without aura and without status migrainosus, not intractable -     rizatriptan (MAXALT) 10 MG tablet; TAKE 1 TABLET BY MOUTH AS NEEDED FOR MIGRAINE. MAY REPEAT IN 2 HOURS IF NEEDED  Class 1 obesity due to excess calories without serious comorbidity with body mass index (BMI) of 34.0 to 34.9 in adult   BP not to goal, on prednisone. Will check at home and report readings to office.  Refilled medication today Cmp ordered LDL to goal, refilled tricor.  TSH to goal Maxalt as needed Discussed A1C and pre-diabetes Offered weight loss medications or metformin Darlene Melendez declined today and will consider  .Marland KitchenDiscussed low carb diet with 1500 calories and 80g of protein.  Exercising at least 150 minutes a week.  [employer] could be a Microbiologist.      Darlene Planas, PA-C

## 2022-06-24 LAB — CBC WITH DIFFERENTIAL/PLATELET
Absolute Monocytes: 201 cells/uL (ref 200–950)
Basophils Absolute: 20 cells/uL (ref 0–200)
Basophils Relative: 0.3 %
Eosinophils Absolute: 0 cells/uL — ABNORMAL LOW (ref 15–500)
Eosinophils Relative: 0 %
HCT: 38.8 % (ref 35.0–45.0)
Hemoglobin: 13.3 g/dL (ref 11.7–15.5)
Lymphs Abs: 1246 cells/uL (ref 850–3900)
MCH: 31.1 pg (ref 27.0–33.0)
MCHC: 34.3 g/dL (ref 32.0–36.0)
MCV: 90.9 fL (ref 80.0–100.0)
MPV: 11.1 fL (ref 7.5–12.5)
Monocytes Relative: 3 %
Neutro Abs: 5233 cells/uL (ref 1500–7800)
Neutrophils Relative %: 78.1 %
Platelets: 311 10*3/uL (ref 140–400)
RBC: 4.27 10*6/uL (ref 3.80–5.10)
RDW: 12.3 % (ref 11.0–15.0)
Total Lymphocyte: 18.6 %
WBC: 6.7 10*3/uL (ref 3.8–10.8)

## 2022-06-24 LAB — LIPID PANEL W/REFLEX DIRECT LDL
Cholesterol: 178 mg/dL (ref <200)
HDL: 81 mg/dL (ref >=50)
LDL Cholesterol (Calc): 80 mg/dL (calc)
Non-HDL Cholesterol (Calc): 97 mg/dL (calc) (ref <130)
Total CHOL/HDL Ratio: 2.2 (calc) (ref <5.0)
Triglycerides: 88 mg/dL (ref <150)

## 2022-06-24 LAB — HEMOGLOBIN A1C
Hgb A1c MFr Bld: 6.5 % of total Hgb — ABNORMAL HIGH (ref <5.7)
Mean Plasma Glucose: 140 mg/dL
eAG (mmol/L): 7.7 mmol/L

## 2022-06-24 LAB — COMPLETE METABOLIC PANEL WITH GFR
AG Ratio: 1.9 (calc) (ref 1.0–2.5)
ALT: 33 U/L — ABNORMAL HIGH (ref 6–29)
AST: 24 U/L (ref 10–35)
Albumin: 5 g/dL (ref 3.6–5.1)
Alkaline phosphatase (APISO): 49 U/L (ref 37–153)
BUN: 19 mg/dL (ref 7–25)
CO2: 31 mmol/L (ref 20–32)
Calcium: 10 mg/dL (ref 8.6–10.4)
Chloride: 100 mmol/L (ref 98–110)
Creat: 0.99 mg/dL (ref 0.50–1.03)
Globulin: 2.6 g/dL (calc) (ref 1.9–3.7)
Glucose, Bld: 150 mg/dL — ABNORMAL HIGH (ref 65–99)
Potassium: 3.9 mmol/L (ref 3.5–5.3)
Sodium: 141 mmol/L (ref 135–146)
Total Bilirubin: 0.4 mg/dL (ref 0.2–1.2)
Total Protein: 7.6 g/dL (ref 6.1–8.1)
eGFR: 68 mL/min/{1.73_m2} (ref >=60)

## 2022-06-24 LAB — TSH: TSH: 0.58 mIU/L

## 2022-06-24 LAB — HEPATITIS C ANTIBODY: Hepatitis C Ab: NONREACTIVE

## 2022-06-25 ENCOUNTER — Encounter: Payer: Self-pay | Admitting: Physician Assistant

## 2022-06-25 DIAGNOSIS — E1165 Type 2 diabetes mellitus with hyperglycemia: Secondary | ICD-10-CM

## 2022-06-25 MED ORDER — MOUNJARO 2.5 MG/0.5ML ~~LOC~~ SOAJ: 2.5000 mg | SUBCUTANEOUS | 0 refills | Status: DC

## 2022-06-25 MED ORDER — TIRZEPATIDE 5 MG/0.5ML ~~LOC~~ SOAJ: 5.0000 mg | SUBCUTANEOUS | 1 refills | Status: DC

## 2022-06-25 NOTE — Progress Notes (Signed)
Your A1C today in the diabetes range. Due to that diagnoses we could start the once weekly ozempic or mounjaro that can help with insulin resistance/weight/sugars. Thoughts? At the very least we need to start metformin.   Hemoglobin looks good.  ALT improving.  Kidney looks good.

## 2022-07-02 ENCOUNTER — Encounter: Payer: Self-pay | Admitting: Physician Assistant

## 2022-07-06 ENCOUNTER — Encounter: Payer: Self-pay | Admitting: Physician Assistant

## 2022-07-07 ENCOUNTER — Telehealth: Payer: Self-pay

## 2022-07-07 NOTE — Telephone Encounter (Signed)
Initiated Prior authorization [id number] 2.'5MG'$ /0.5ML pen-injectors Via: Covermymeds Case/Key:[id number]  Status: Pending as of /07/07/22 Reason: Notified Pt via: Mychart

## 2022-07-28 ENCOUNTER — Ambulatory Visit (INDEPENDENT_AMBULATORY_CARE_PROVIDER_SITE_OTHER): Payer: No Typology Code available for payment source | Admitting: Allergy

## 2022-07-28 ENCOUNTER — Other Ambulatory Visit: Payer: Self-pay

## 2022-07-28 ENCOUNTER — Encounter: Payer: Self-pay | Admitting: Physician Assistant

## 2022-07-28 ENCOUNTER — Encounter: Payer: Self-pay | Admitting: Allergy

## 2022-07-28 VITALS — BP 116/80 | HR 69 | Temp 97.8°F | Resp 16 | Ht 66.0 in | Wt 208.5 lb

## 2022-07-28 DIAGNOSIS — H1013 Acute atopic conjunctivitis, bilateral: Secondary | ICD-10-CM | POA: Diagnosis not present

## 2022-07-28 DIAGNOSIS — J3089 Other allergic rhinitis: Secondary | ICD-10-CM | POA: Diagnosis not present

## 2022-07-28 MED ORDER — LEVOCETIRIZINE DIHYDROCHLORIDE 5 MG PO TABS: 5.0000 mg | ORAL_TABLET | Freq: Every evening | ORAL | 5 refills | Status: DC

## 2022-07-28 MED ORDER — OLOPATADINE HCL 0.2 % OP SOLN: 1.0000 [drp] | Freq: Every day | OPHTHALMIC | 5 refills | Status: AC | PRN

## 2022-07-28 MED ORDER — RYALTRIS 665-25 MCG/ACT NA SUSP: NASAL | 5 refills | Status: DC

## 2022-07-28 NOTE — Patient Instructions (Signed)
-   Testing today showed: weeds, trees, indoor molds, outdoor molds, dust mites, and cat. - Copy of test results provided.  - Avoidance measures provided. - Try taking: Xyzal (levocetirizine) 5mg  tablet once daily. Ryaltris (olopatadine/mometasone) two sprays per nostril 1-2 times daily as needed for nasal congestion or drainage.  Point tip of bottle towards eye on same side nostril and lean forward a bit for best technique.  Pataday (olopatadine) one drop per eye daily as needed for itchy/watery eyes.  - You can use an extra dose of the antihistamine, if needed, for breakthrough symptoms.  - Recommend nasal saline rinses 3-7 times week to remove allergens from the nasal cavities as well as help with mucous clearance (this is especially helpful to do before the nasal sprays are given) - Consider allergy shots as a means of long-term control if medication management is not effective enough in controlling  symptoms. - Allergy shots "re-train" and "reset" the immune system to ignore environmental allergens and decrease the resulting immune response to those allergens (sneezing, itchy watery eyes, runny nose, nasal congestion, etc).    - Allergy shots improve symptoms in 75-85% of patients.   Follow-up in 4-6 months or sooner if needed

## 2022-07-28 NOTE — Progress Notes (Signed)
New Patient Note  RE: Darlene Melendez MRN: [id number] DOB: 08/21/1970 Date of Office Visit: 07/28/2022   Primary care provider: Donella Stade, PA-C  Chief Complaint: Allergies  History of present illness: Darlene Melendez is a 52 y.o. female presenting today for evaluation of allergic rhinitis.  She reports symptoms of nasal drainage with throat clearing, stuffy nose, watery eyes and gritty feeling, headaches, cough, sneezing, ear stopped up.  These symptoms usually occur from March to April and again Sept to Nov.    She states she will try taking allerclear, mucinex, sudafed.   She has used afrin. She has also tried nasacort and flonase.  She does believe that AllerClear is somewhat helpful.  Has not used any eye drops.  She states when symptoms have been bad she will go to PCP and has received prednisone or steroid injection which does help the symptoms.    No history of asthma, eczema or food allergy.   Review of systems: Review of Systems  Constitutional: Negative.   HENT:         See HPI  Eyes:        See HPI  Respiratory:         See HPI  Cardiovascular: Negative.   Gastrointestinal: Negative.   Musculoskeletal: Negative.   Skin: Negative.   Allergic/Immunologic: Negative.   Neurological: Negative.     All other systems negative unless noted above in HPI  Past medical history: Past Medical History:  Diagnosis Date   Anxiety    Hypertension    Migraines    Pneumonia     Past surgical history: Past Surgical History:  Procedure Laterality Date   CHOLECYSTECTOMY  2011    Family history:  Family History  Problem Relation Age of Onset   Hypertension Mother    Hypertension Father    Diabetes Father    Cancer Father     Social history: Lives in a home with carpeting in the bedroom with heat pump heating and central cooling.  Dogs in the home occasionally.  Maybe there is water damage/mildew in the home.  She is an admin asst. Denies smoking  history.    Medication List: Current Outpatient Medications  Medication Sig Dispense Refill   cholecalciferol (VITAMIN D3) 25 MCG (1000 UT) tablet Take 1,000 Units by mouth daily.     citalopram (CELEXA) 20 MG tablet Take 20 mg by mouth daily.     enalapril (VASOTEC) 10 MG tablet Take 1 tablet (10 mg total) by mouth daily. 90 tablet 3   fenofibrate (TRICOR) 145 MG tablet Take 1 tablet (145 mg total) by mouth daily. 90 tablet 3   hydrochlorothiazide (HYDRODIURIL) 25 MG tablet Take 1 tablet (25 mg total) by mouth daily. 90 tablet 3   ibuprofen (ADVIL,MOTRIN) 800 MG tablet Take 1 tablet (800 mg total) by mouth every 8 (eight) hours as needed. 60 tablet 2   KRILL OIL PO Take by mouth daily.     rizatriptan (MAXALT) 10 MG tablet TAKE 1 TABLET BY MOUTH AS NEEDED FOR MIGRAINE. MAY REPEAT IN 2 HOURS IF NEEDED 18 tablet 1   No current facility-administered medications for this visit.    Known medication allergies: Allergies  Allergen Reactions   Imitrex [Sumatriptan] Nausea Only    Nauseated.     Physical examination: Blood pressure 116/80, pulse 69, temperature 97.8 F (36.6 C), resp. rate 16, height 5\' 6"  (1.676 m), weight 208 lb 8 oz (94.6 kg), last menstrual period  04/05/2018, SpO2 95 %.  General: Alert, interactive, in no acute distress. HEENT: PERRLA, TMs pearly gray, turbinates moderately edematous without discharge, post-pharynx non erythematous. Neck: Supple without lymphadenopathy. Lungs: Clear to auscultation without wheezing, rhonchi or rales. {no increased work of breathing. CV: Normal S1, S2 without murmurs. Abdomen: Nondistended, nontender. Skin: Warm and dry, without lesions or rashes. Extremities:  No clubbing, cyanosis or edema. Neuro:   Grossly intact.  Diagnositics/Labs:  Allergy testing:   Airborne Adult Perc - 07/28/22 1500     Time Antigen Placed 1500    Allergen Manufacturer Lavella Hammock    Location Back    Number of Test 56    1. Control-Buffer 50% Glycerol  Negative    2. Control-Histamine 1 mg/ml 2+    3. Albumin saline Negative    4. Bath Negative    5. [place] Negative    6. Johnson Negative    7. [place] Blue Negative    10. Sweet Vernal Negative    11. Timothy Negative    12. Cocklebur Negative    13. Burweed Marshelder Negative    14. Ragweed, short Negative    15. Ragweed, Giant Negative    16. Plantain,  English Negative    17. Lamb's Quarters Negative    18. Sheep Sorrell Negative    19. Rough Pigweed Negative    20. Marsh Elder, Rough Negative    21. Mugwort, Common Negative    22. Ash mix Negative    23. Birch mix Negative    24. Beech American Negative    25. Box, Elder Negative    26. Cedar, red Negative    27. Cottonwood, [place] Negative    28. Elm mix Negative    29. Hickory Negative    30. Maple mix 2+    31. Oak, [place] mix Negative    32. Pecan Pollen Negative    33. Pine mix Negative    34. Sycamore Eastern Negative    35. McKenzie, Black Pollen Negative    36. Alternaria alternata Negative    37. Cladosporium Herbarum Negative    38. Aspergillus mix Negative    39. Penicillium mix Negative    40. Bipolaris sorokiniana (Helminthosporium) Negative    41. Drechslera spicifera (Curvularia) 2+    42. Mucor plumbeus Negative    43. Fusarium moniliforme Negative    44. Aureobasidium pullulans (pullulara) Negative    45. Rhizopus oryzae Negative    46. Botrytis cinera Negative    47. Epicoccum nigrum Negative    48. Phoma betae Negative    49. Candida Albicans Negative    50. Trichophyton mentagrophytes Negative    51. Mite, D Farinae  5,000 AU/ml 2+    52. Mite, D Pteronyssinus  5,000 AU/ml Negative    53. Cat Hair 10,000 BAU/ml Negative    54.  Dog Epithelia Negative    55. Mixed Feathers Negative    56. Horse Epithelia Negative    57. Cockroach, German Negative    58. Mouse Negative    59. Tobacco Leaf Negative             Intradermal - 07/28/22 1500     Time Antigen Placed 1525     Allergen Manufacturer Lavella Hammock    Location Arm    Number of Test 10    Control Negative    7 Grass Negative    Ragweed mix 2+    Weed mix Negative    Mold 1 Negative    Mold  2 2+    Mold 3 Negative    Mold 4 2+    Cat 2+    Dog Negative    Cockroach Negative             Allergy testing results were read and interpreted by provider, documented by clinical staff.   Assessment and plan: Allergic rhinitis with conjunctivitis - Testing today showed: weeds, trees, indoor molds, outdoor molds, dust mites, and cat. - Copy of test results provided.  - Avoidance measures provided. - Try taking: Xyzal (levocetirizine) 5mg  tablet once daily. Ryaltris (olopatadine/mometasone) two sprays per nostril 1-2 times daily as needed for nasal congestion or drainage.  Point tip of bottle towards eye on same side nostril and lean forward a bit for best technique.  Pataday (olopatadine) one drop per eye daily as needed for itchy/watery eyes.  - You can use an extra dose of the antihistamine, if needed, for breakthrough symptoms.  - Recommend nasal saline rinses 3-7 times week to remove allergens from the nasal cavities as well as help with mucous clearance (this is especially helpful to do before the nasal sprays are given) - Consider allergy shots as a means of long-term control if medication management is not effective enough in controlling  symptoms. - Allergy shots "re-train" and "reset" the immune system to ignore environmental allergens and decrease the resulting immune response to those allergens (sneezing, itchy watery eyes, runny nose, nasal congestion, etc).    - Allergy shots improve symptoms in 75-85% of patients.   Follow-up in 4-6 months or sooner if needed I appreciate the opportunity to take part in Darlene Melendez's care. Please do not hesitate to contact me with questions.  Sincerely,   Prudy Feeler, MD Allergy/Immunology [facility]

## 2022-09-28 LAB — HM MAMMOGRAPHY

## 2022-12-02 ENCOUNTER — Ambulatory Visit: Payer: No Typology Code available for payment source | Admitting: Allergy

## 2022-12-22 ENCOUNTER — Encounter: Payer: Self-pay | Admitting: Physician Assistant

## 2022-12-22 DIAGNOSIS — I10 Essential (primary) hypertension: Secondary | ICD-10-CM

## 2022-12-22 DIAGNOSIS — E6609 Other obesity due to excess calories: Secondary | ICD-10-CM

## 2022-12-22 DIAGNOSIS — E1165 Type 2 diabetes mellitus with hyperglycemia: Secondary | ICD-10-CM

## 2022-12-24 MED ORDER — MOUNJARO 2.5 MG/0.5ML ~~LOC~~ SOAJ: 2.5000 mg | SUBCUTANEOUS | 0 refills | Status: DC

## 2022-12-24 NOTE — Telephone Encounter (Signed)
Should I reorder this? Is it worth it?

## 2023-01-07 ENCOUNTER — Ambulatory Visit (INDEPENDENT_AMBULATORY_CARE_PROVIDER_SITE_OTHER): Payer: No Typology Code available for payment source | Admitting: Allergy

## 2023-01-07 ENCOUNTER — Other Ambulatory Visit: Payer: Self-pay

## 2023-01-07 ENCOUNTER — Encounter: Payer: Self-pay | Admitting: Allergy

## 2023-01-07 VITALS — BP 132/80 | HR 95 | Temp 97.3°F | Resp 18 | Ht 65.0 in | Wt 212.7 lb

## 2023-01-07 DIAGNOSIS — H1013 Acute atopic conjunctivitis, bilateral: Secondary | ICD-10-CM

## 2023-01-07 DIAGNOSIS — J3089 Other allergic rhinitis: Secondary | ICD-10-CM | POA: Diagnosis not present

## 2023-01-07 MED ORDER — LEVOCETIRIZINE DIHYDROCHLORIDE 5 MG PO TABS: 5.0000 mg | ORAL_TABLET | Freq: Every evening | ORAL | 5 refills | Status: DC

## 2023-01-07 MED ORDER — RYALTRIS 665-25 MCG/ACT NA SUSP: NASAL | 5 refills | Status: DC

## 2023-01-07 NOTE — Progress Notes (Signed)
Follow-up Note  RE: Darlene Melendez MRN: [id number] DOB: 1971/03/01 Date of Office Visit: 01/07/2023   History of present illness: Darlene Melendez is a 52 y.o. female presenting today for follow-up of allergic rhinitis with conjunctivitis.  She was last seen in the office on 07/28/2022 by myself.  She has not had any major health changes, surgeries or hospitalizations since this visit. She states the changes to her allergy regimen have been helpful.  The Xyzal does seem to be more effective than the AllerClear she was taking before.  She also states the Ryaltris nasal spray helps with her nasal drainage and congestion control.  Does not completely control the drainage but it is definitely much better than it was.  She uses the Ryaltris spray 1 to 2 sprays each nostril twice a day mostly.  She has not needed to use the olopatadine eyedrop which states she does have it at home if she needed to use it.  As long as these medications continue to work she states she would prefer continuing this option versus immunotherapy.   Review of systems: 10pt ROS negative unless noted above in HPI  Past medical/social/surgical/family history have been reviewed and are unchanged unless specifically indicated below.  No changes  Medication List: Current Outpatient Medications  Medication Sig Dispense Refill   cholecalciferol (VITAMIN D3) 25 MCG (1000 UT) tablet Take 1,000 Units by mouth daily.     citalopram (CELEXA) 20 MG tablet Take 20 mg by mouth daily.     enalapril (VASOTEC) 10 MG tablet Take 1 tablet (10 mg total) by mouth daily. 90 tablet 3   fenofibrate (TRICOR) 145 MG tablet Take 1 tablet (145 mg total) by mouth daily. 90 tablet 3   hydrochlorothiazide (HYDRODIURIL) 25 MG tablet Take 1 tablet (25 mg total) by mouth daily. 90 tablet 3   ibuprofen (ADVIL,MOTRIN) 800 MG tablet Take 1 tablet (800 mg total) by mouth every 8 (eight) hours as needed. 60 tablet 2   KRILL OIL PO Take by mouth daily.      Olopatadine HCl (PATADAY) 0.2 % SOLN Place 1 drop into both eyes daily as needed. 2.5 mL 5   rizatriptan (MAXALT) 10 MG tablet TAKE 1 TABLET BY MOUTH AS NEEDED FOR MIGRAINE. MAY REPEAT IN 2 HOURS IF NEEDED 18 tablet 1   tirzepatide (MOUNJARO) 2.5 MG/0.5ML Pen Inject 2.5 mg into the skin once a week. 2 mL 0   levocetirizine (XYZAL) 5 MG tablet Take 1 tablet (5 mg total) by mouth every evening. 30 tablet 5   Olopatadine-Mometasone (RYALTRIS) 665-25 MCG/ACT SUSP 2 sprays 1-2 times daily as needed for nasal congestion or drainage. 29 g 5   No current facility-administered medications for this visit.     Known medication allergies: Allergies  Allergen Reactions   Imitrex [Sumatriptan] Nausea Only    Nauseated.     Physical examination: Blood pressure 132/80, pulse 95, temperature (!) 97.3 F (36.3 C), resp. rate 18, height 5\' 5"  (1.651 m), weight 212 lb 11.2 oz (96.5 kg), last menstrual period 04/05/2018, SpO2 97%.  General: Alert, interactive, in no acute distress. HEENT: PERRLA, TMs pearly gray, turbinates non-edematous without discharge, post-pharynx non erythematous. Neck: Supple without lymphadenopathy. Lungs: Clear to auscultation without wheezing, rhonchi or rales. {no increased work of breathing. CV: Normal S1, S2 without murmurs. Abdomen: Nondistended, nontender. Skin: Warm and dry, without lesions or rashes. Extremities:  No clubbing, cyanosis or edema. Neuro:   Grossly intact.  Diagnositics/Labs: None today  Assessment and plan: Allergic rhinitis with conjunctivitis  - Continue avoidance measures for weeds, trees, indoor molds, outdoor molds, dust mites, and cat. - Continue: Xyzal (levocetirizine) 5mg  tablet once daily. Ryaltris (olopatadine/mometasone) two sprays per nostril 1-2 times daily as needed for nasal congestion or drainage.  Point tip of bottle towards eye on same side nostril and lean forward a bit for best technique.  Pataday (olopatadine) one drop per eye  daily as needed for itchy/watery eyes.  - You can use an extra dose of the antihistamine, if needed, for breakthrough symptoms.  - Recommend nasal saline rinses 3-7 times week to remove allergens from the nasal cavities as well as help with mucous clearance (this is especially helpful to do before the nasal sprays are given).  Rinse kit provided - Consider allergy shots as a means of long-term control if medication management is not effective enough in controlling  symptoms. - Allergy shots "re-train" and "reset" the immune system to ignore environmental allergens and decrease the resulting immune response to those allergens (sneezing, itchy watery eyes, runny nose, nasal congestion, etc).    - Allergy shots improve symptoms in 75-85% of patients.   Follow-up in 6-12 months or sooner if needed  I appreciate the opportunity to take part in Darlene Melendez's care. Please do not hesitate to contact me with questions.  Sincerely,   Margo Aye, MD Allergy/Immunology [facility]

## 2023-01-07 NOTE — Patient Instructions (Addendum)
-   Continue avoidance measures for weeds, trees, indoor molds, outdoor molds, dust mites, and cat. - Continue: Xyzal (levocetirizine) 5mg  tablet once daily. Ryaltris (olopatadine/mometasone) two sprays per nostril 1-2 times daily as needed for nasal congestion or drainage.  Point tip of bottle towards eye on same side nostril and lean forward a bit for best technique.  Pataday (olopatadine) one drop per eye daily as needed for itchy/watery eyes.  - You can use an extra dose of the antihistamine, if needed, for breakthrough symptoms.  - Recommend nasal saline rinses 3-7 times week to remove allergens from the nasal cavities as well as help with mucous clearance (this is especially helpful to do before the nasal sprays are given).  Rinse kit provided - Consider allergy shots as a means of long-term control if medication management is not effective enough in controlling  symptoms. - Allergy shots "re-train" and "reset" the immune system to ignore environmental allergens and decrease the resulting immune response to those allergens (sneezing, itchy watery eyes, runny nose, nasal congestion, etc).    - Allergy shots improve symptoms in 75-85% of patients.   Follow-up in 6-12 months or sooner if needed

## 2023-01-14 ENCOUNTER — Encounter: Payer: Self-pay | Admitting: Physician Assistant

## 2023-01-14 ENCOUNTER — Telehealth: Payer: No Typology Code available for payment source | Admitting: Physician Assistant

## 2023-01-14 VITALS — Wt 215.0 lb

## 2023-01-14 DIAGNOSIS — I10 Essential (primary) hypertension: Secondary | ICD-10-CM

## 2023-01-14 DIAGNOSIS — E781 Pure hyperglyceridemia: Secondary | ICD-10-CM

## 2023-01-14 DIAGNOSIS — Z6835 Body mass index (BMI) 35.0-35.9, adult: Secondary | ICD-10-CM

## 2023-01-14 DIAGNOSIS — E1165 Type 2 diabetes mellitus with hyperglycemia: Secondary | ICD-10-CM | POA: Diagnosis not present

## 2023-01-14 DIAGNOSIS — E785 Hyperlipidemia, unspecified: Secondary | ICD-10-CM

## 2023-01-14 MED ORDER — TIRZEPATIDE 5 MG/0.5ML ~~LOC~~ SOAJ: 5.0000 mg | SUBCUTANEOUS | 1 refills | Status: DC

## 2023-01-14 MED ORDER — MOUNJARO 2.5 MG/0.5ML ~~LOC~~ SOAJ: 2.5000 mg | SUBCUTANEOUS | 0 refills | Status: DC

## 2023-01-14 NOTE — Telephone Encounter (Signed)
Ok note done! Thank you.

## 2023-01-14 NOTE — Progress Notes (Signed)
..Virtual Visit via Video Note  I connected with Darlene Melendez on 01/14/23 at 10:10 AM EDT by a video enabled telemedicine application and verified that I am speaking with the correct person using two identifiers.  Location: Patient: work Provider: clinic  .Marland KitchenParticipating in visit:  Patient: Darlene Melendez Provider: Tandy Gaw PA-C Provider in training: Weston Anna PA-S   I discussed the limitations of evaluation and management by telemedicine and the availability of in person appointments. The patient expressed understanding and agreed to proceed.  History of Present Illness: Pt is a 52 yo obese female with new dx of DM and past hx of HTN/HLD who calls into the clinic to get [facility] approved. Her insurance denied it but stated she needed appt.   .. Lab Results  Component Value Date   HGBA1C 6.5 (H) 06/23/2022   She has gained 5 more lbs over the summer. Not checking sugars. She is not exercising.    .. Active Ambulatory Problems    Diagnosis Date Noted   Migraine without aura 03/31/2007   ESSENTIAL HYPERTENSION, BENIGN 03/31/2007   GALLSTONES 08/13/2009   NECK STIFFNESS 06/09/2007   ROTATOR CUFF SYNDROME, RIGHT 05/27/2008   Weight gain 03/31/2007   Obesity 08/13/2014   Dyslipidemia, goal LDL below 70 08/16/2014   Hypokalemia 09/01/2015   Cephalalgia 09/01/2015   Elevated liver enzymes 09/02/2015   Skin lesion 03/22/2016   Knee crepitus 03/22/2016   Primary osteoarthritis of knees, bilateral 03/22/2016   Hypertriglyceridemia 04/07/2016   Accessory skin tags 09/07/2017   Post-menopausal 06/23/2020   Serum calcium elevated 06/23/2020   Thyroid disorder screen 06/23/2020   Elevated fasting glucose 06/23/2020   Acute non intractable tension-type headache 06/23/2020   Pre-diabetes 06/23/2022   Type 2 diabetes mellitus with hyperglycemia (HCC) 06/25/2022   Resolved Ambulatory Problems    Diagnosis Date Noted   Acute sinusitis, unspecified 04/04/2009   RUQ PAIN  08/06/2009   VIRAL URI 05/06/2010   Skin macule 01/01/2015   CAP (community acquired pneumonia) 09/01/2015   Obesity (BMI 35.0-39.9 without comorbidity) 10/12/2016   Influenza-like illness 05/30/2017   Acute non-recurrent maxillary sinusitis 04/03/2018   Past Medical History:  Diagnosis Date   Anxiety    Hypertension    Migraines    Pneumonia     Observations/Objective: No acute distress Normal mood and appearance  .Marland Kitchen Today's Vitals   01/14/23 1000  Weight: 215 lb (97.5 kg)   Body mass index is 35.78 kg/m.    Assessment and Plan: Marland KitchenMarland KitchenJoselynn was seen today for medical management of chronic issues.  Diagnoses and all orders for this visit:  Type 2 diabetes mellitus with hyperglycemia, without long-term current use of insulin (HCC)  Class 2 severe obesity due to excess calories with serious comorbidity and body mass index (BMI) of 35.0 to 35.9 in adult [facility])  ESSENTIAL HYPERTENSION, BENIGN  Hypertriglyceridemia  Dyslipidemia, goal LDL below 70   Discussed DM and new goals:  LDL under 70  BP under 130/80 Yearly eye exam Foot exam and urine check every 6 months Discussed DM diet and importance of exercise BP controlled on medication, on ACE Start mounjaro 2.5mg  and then increase after 1 month to 5mg  weekly Discussed SE Follow up in 3 months  [facility] at [facility] will get copy    Follow Up Instructions:    I discussed the assessment and treatment plan with the patient. The patient was provided an opportunity to ask questions and all were answered. The patient agreed with the plan  and demonstrated an understanding of the instructions.   The patient was advised to call back or seek an in-person evaluation if the symptoms worsen or if the condition fails to improve as anticipated.   Tandy Gaw, PA-C

## 2023-01-24 ENCOUNTER — Encounter: Payer: Self-pay | Admitting: Physician Assistant

## 2023-07-07 ENCOUNTER — Other Ambulatory Visit: Payer: Self-pay | Admitting: Physician Assistant

## 2023-07-07 DIAGNOSIS — I10 Essential (primary) hypertension: Secondary | ICD-10-CM

## 2023-07-08 ENCOUNTER — Other Ambulatory Visit: Payer: Self-pay | Admitting: Physician Assistant

## 2023-07-08 DIAGNOSIS — E781 Pure hyperglyceridemia: Secondary | ICD-10-CM

## 2023-08-05 ENCOUNTER — Other Ambulatory Visit: Payer: Self-pay | Admitting: Allergy

## 2023-10-21 ENCOUNTER — Ambulatory Visit (INDEPENDENT_AMBULATORY_CARE_PROVIDER_SITE_OTHER): Payer: No Typology Code available for payment source | Admitting: Allergy

## 2023-10-21 ENCOUNTER — Encounter: Payer: Self-pay | Admitting: Allergy

## 2023-10-21 ENCOUNTER — Other Ambulatory Visit: Payer: Self-pay

## 2023-10-21 VITALS — BP 128/82 | HR 62 | Temp 98.0°F | Ht 65.35 in | Wt 218.9 lb

## 2023-10-21 DIAGNOSIS — J3089 Other allergic rhinitis: Secondary | ICD-10-CM

## 2023-10-21 DIAGNOSIS — H1013 Acute atopic conjunctivitis, bilateral: Secondary | ICD-10-CM | POA: Diagnosis not present

## 2023-10-21 MED ORDER — LEVOCETIRIZINE DIHYDROCHLORIDE 5 MG PO TABS: 5.0000 mg | ORAL_TABLET | Freq: Every evening | ORAL | 12 refills | Status: AC

## 2023-10-21 MED ORDER — RYALTRIS 665-25 MCG/ACT NA SUSP: NASAL | 5 refills | Status: AC

## 2023-10-21 NOTE — Patient Instructions (Addendum)
-   Continue avoidance measures for weeds, trees, indoor molds, outdoor molds, dust mites, and cat. - Continue:   Xyzal  (levocetirizine) 5mg  tablet once daily. Can take additional dose if needed for extra control.   Ryaltris  (olopatadine /mometasone ) two sprays per nostril 1-2 times daily as needed for nasal congestion or drainage.  Point tip of bottle towards eye on same side nostril and lean forward a bit for best technique.  Pataday  (olopatadine ) one drop per eye daily as needed for itchy/watery eyes.  - Recommend nasal saline rinses 3-7 times week to remove allergens from the nasal cavities as well as help with mucous clearance (this is especially helpful to do before the nasal sprays are given).  Use either distilled water or boil water and let cool to room temp prior to use.  - Consider allergy  shots as a means of long-term control if medication management is not effective enough in controlling  symptoms. - Allergy  shots re-train and reset the immune system to ignore environmental allergens and decrease the resulting immune response to those allergens (sneezing, itchy watery eyes, runny nose, nasal congestion, etc).    - Allergy  shots improve symptoms in 75-85% of patients.   Follow-up in 12 months or sooner if needed

## 2023-10-21 NOTE — Progress Notes (Signed)
 Follow-up Note  RE: Darlene Melendez MRN: [id number] DOB: 08-09-1970 Date of Office Visit: 10/21/2023   History of present illness: Darlene Melendez is a 53 y.o. female presenting today for follow-up of allergic rhinitis and conjunctivitis.  She was last seen in the office on 01/07/2023 by myself. Discussed the use of AI scribe software for clinical note transcription with the patient, who gave verbal consent to proceed.  She experienced an upper respiratory infection this past spring, which was more severe than previous years due to a high pollen count. The infection resolved faster than usual but required antibiotics and a course of steroids.  She continues to take Xyzal  nightly as an antihistamine and finds it effective, rarely missing a dose. However, she has noticed increased congestion since her son and daughter-in-law, who have a dog, moved in with her.   She has used Ryaltris  nasal spray in the past and found it effective, but she is not currently using it as she stopped getting refills in the mail. She has not needed to use eye drops recently and has not performed nasal rinses lately, although she found them helpful during previous illnesses.     Review of systems: 10pt ROS negative unless noted above in HPI   Past medical/social/surgical/family history have been reviewed and are unchanged unless specifically indicated below.  No changes  Medication List: Current Outpatient Medications  Medication Sig Dispense Refill   cholecalciferol (VITAMIN D3) 25 MCG (1000 UT) tablet Take 1,000 Units by mouth daily.     citalopram (CELEXA) 20 MG tablet Take 20 mg by mouth daily.     enalapril  (VASOTEC ) 10 MG tablet TAKE 1 TABLET BY MOUTH EVERY DAY 90 tablet 3   fenofibrate  (TRICOR ) 145 MG tablet TAKE 1 TABLET BY MOUTH EVERY DAY 90 tablet 2   hydrochlorothiazide  (HYDRODIURIL ) 25 MG tablet TAKE 1 TABLET (25 MG TOTAL) BY MOUTH DAILY. 90 tablet 3   ibuprofen  (ADVIL ,MOTRIN ) 800 MG tablet  Take 1 tablet (800 mg total) by mouth every 8 (eight) hours as needed. 60 tablet 2   KRILL OIL PO Take by mouth daily.     levocetirizine (XYZAL ) 5 MG tablet TAKE 1 TABLET BY MOUTH EVERY DAY IN THE EVENING 30 tablet 5   Olopatadine -Mometasone  (RYALTRIS ) 665-25 MCG/ACT SUSP 2 sprays 1-2 times daily as needed for nasal congestion or drainage. 29 g 5   rizatriptan  (MAXALT ) 10 MG tablet TAKE 1 TABLET BY MOUTH AS NEEDED FOR MIGRAINE. MAY REPEAT IN 2 HOURS IF NEEDED (Patient taking differently: as needed. TAKE 1 TABLET BY MOUTH AS NEEDED FOR MIGRAINE. MAY REPEAT IN 2 HOURS IF NEEDED) 18 tablet 1   Olopatadine  HCl (PATADAY ) 0.2 % SOLN Place 1 drop into both eyes daily as needed. 2.5 mL 5   tirzepatide  (MOUNJARO ) 2.5 MG/0.5ML Pen Inject 2.5 mg into the skin once a week. 2 mL 0   tirzepatide  (MOUNJARO ) 5 MG/0.5ML Pen Inject 5 mg into the skin once a week. 2 mL 1   No current facility-administered medications for this visit.     Known medication allergies: Allergies  Allergen Reactions   Imitrex  [Sumatriptan ] Nausea Only    Nauseated.     Physical examination: Blood pressure 128/82, pulse 62, temperature 98 F (36.7 C), temperature source Temporal, height 5' 5.35 (1.66 m), weight 218 lb 14.4 oz (99.3 kg), last menstrual period 04/05/2018, SpO2 96%.  General: Alert, interactive, in no acute distress. HEENT: PERRLA, TMs pearly gray, turbinates non-edematous without discharge, post-pharynx non  erythematous. Neck: Supple without lymphadenopathy. Lungs: Clear to auscultation without wheezing, rhonchi or rales. {no increased work of breathing. CV: Normal S1, S2 without murmurs. Abdomen: Nondistended, nontender. Skin: Warm and dry, without lesions or rashes. Extremities:  No clubbing, cyanosis or edema. Neuro:   Grossly intact.  Diagnostics/Labs: None today  Assessment and plan: Allergic rhinitis with conjunctivitis - Continue avoidance measures for weeds, trees, indoor molds, outdoor molds,  dust mites, and cat. - Continue:   Xyzal  (levocetirizine) 5mg  tablet once daily. Can take additional dose if needed for extra control.   Ryaltris  (olopatadine /mometasone ) two sprays per nostril 1-2 times daily as needed for nasal congestion or drainage.  Point tip of bottle towards eye on same side nostril and lean forward a bit for best technique.  Pataday  (olopatadine ) one drop per eye daily as needed for itchy/watery eyes.  - Recommend nasal saline rinses 3-7 times week to remove allergens from the nasal cavities as well as help with mucous clearance (this is especially helpful to do before the nasal sprays are given).  Use either distilled water or boil water and let cool to room temp prior to use.  - Consider allergy  shots as a means of long-term control if medication management is not effective enough in controlling  symptoms. - Allergy  shots re-train and reset the immune system to ignore environmental allergens and decrease the resulting immune response to those allergens (sneezing, itchy watery eyes, runny nose, nasal congestion, etc).    - Allergy  shots improve symptoms in 75-85% of patients.   Follow-up in 12 months or sooner if needed  I appreciate the opportunity to take part in Darlene Melendez's care. Please do not hesitate to contact me with questions.  Sincerely,   Danita Brain, MD Allergy /Immunology [facility]

## 2023-12-29 ENCOUNTER — Telehealth: Payer: Self-pay

## 2023-12-29 NOTE — Telephone Encounter (Signed)
 Pharmacy Patient Advocate Encounter   Received notification from CoverMyMeds that prior authorization for Mounjaro  2.5mg /0.6ml is due for renewal.   Insurance verification completed.   The patient is insured through [facility].  Action: Patient hasn't been seen in your office in over a year. Plan requires updated chart notes for PA renewal.

## 2023-12-29 NOTE — Telephone Encounter (Signed)
 Please contact the patient for scheduling with the provider for medication management. Last appointment was over a year ago. Thanks in advance.

## 2024-01-06 ENCOUNTER — Ambulatory Visit (INDEPENDENT_AMBULATORY_CARE_PROVIDER_SITE_OTHER): Admitting: Physician Assistant

## 2024-01-06 VITALS — BP 122/78 | HR 74 | Ht 65.0 in | Wt 218.0 lb

## 2024-01-06 DIAGNOSIS — G43009 Migraine without aura, not intractable, without status migrainosus: Secondary | ICD-10-CM

## 2024-01-06 DIAGNOSIS — E1165 Type 2 diabetes mellitus with hyperglycemia: Secondary | ICD-10-CM

## 2024-01-06 DIAGNOSIS — E1169 Type 2 diabetes mellitus with other specified complication: Secondary | ICD-10-CM

## 2024-01-06 DIAGNOSIS — E66812 Obesity, class 2: Secondary | ICD-10-CM

## 2024-01-06 DIAGNOSIS — E785 Hyperlipidemia, unspecified: Secondary | ICD-10-CM

## 2024-01-06 DIAGNOSIS — I10 Essential (primary) hypertension: Secondary | ICD-10-CM | POA: Diagnosis not present

## 2024-01-06 DIAGNOSIS — E781 Pure hyperglyceridemia: Secondary | ICD-10-CM

## 2024-01-06 DIAGNOSIS — Z6836 Body mass index (BMI) 36.0-36.9, adult: Secondary | ICD-10-CM

## 2024-01-06 DIAGNOSIS — Z79899 Other long term (current) drug therapy: Secondary | ICD-10-CM

## 2024-01-06 LAB — POCT GLYCOSYLATED HEMOGLOBIN (HGB A1C): Hemoglobin A1C: 6.3 % — AB (ref 4.0–5.6)

## 2024-01-06 LAB — POCT UA - MICROALBUMIN
Albumin/Creatinine Ratio, Urine, POC: <30
Creatinine, POC: 100 mg/dL
Microalbumin Ur, POC: 10 mg/L

## 2024-01-06 MED ORDER — METFORMIN HCL 1000 MG PO TABS: 1000.0000 mg | ORAL_TABLET | Freq: Two times a day (BID) | ORAL | 3 refills | Status: AC

## 2024-01-06 MED ORDER — NALTREXONE-BUPROPION HCL ER 8-90 MG PO TB12: ORAL_TABLET | ORAL | 0 refills | Status: AC

## 2024-01-06 MED ORDER — NALTREXONE-BUPROPION HCL ER 8-90 MG PO TB12: 2.0000 | ORAL_TABLET | Freq: Two times a day (BID) | ORAL | 3 refills | Status: DC

## 2024-01-06 NOTE — Patient Instructions (Signed)
 Start contrave  for weight loss. First month will be a taper up then twice a day.  Metformin  twice a day with meals.   Naltrexone ; Bupropion  Extended-Release Tablets What is this medication? NALTREXONE ; BUPROPION  (nal TREX one; byoo PROE pee on) promotes weight loss. It may also be used to maintain weight loss. It works by decreasing appetite. Changes to diet and exercise are often combined with this medication. This medicine may be used for other purposes; ask your health care provider or pharmacist if you have questions. COMMON BRAND NAME(S): Contrave  What should I tell my care team before I take this medication? They need to know if you have any of these conditions: An eating disorder, such as anorexia or bulimia Diabetes Depression Frequently drink alcohol Glaucoma Head injury Heart disease High blood pressure History of a tumor or infection of your brain or spine History of heart attack or stroke History of irregular heartbeat History of substance use disorder or alcohol use disorder Kidney disease Liver disease Low levels of sodium in the blood Mental health condition Seizures Suicidal thoughts, plans, or attempt by you or a family member Taken an MAOI like Carbex, Eldepryl, Marplan, Nardil, or Parnate in last 14 days An unusual or allergic reaction to bupropion , naltrexone , other medications, foods, dyes, or preservatives Breast-feeding Pregnant or trying to become pregnant How should I use this medication? Take this medication by mouth with a full glass of water. Take it as directed on the prescription label at the same time every day. Do not cut, crush, or chew this medication. Swallow the tablets whole. You can take it with or without food. Do not take with high-fat meals as this may increase your risk of seizures. Keep taking it unless your care team tells you to stop. A special MedGuide will be given to you by the pharmacist with each prescription and refill. Be sure to  read this information carefully each time. Talk to your care team about the use of this medication in children. Special care may be needed. Overdosage: If you think you have taken too much of this medicine contact a poison control center or emergency room at once. NOTE: This medicine is only for you. Do not share this medicine with others. What if I miss a dose? If you miss a dose, skip it. Take your next dose at the normal time. Do not take extra or 2 doses at the same time to make up for the missed dose. What may interact with this medication? Do not take this medication with any of the following: Any medications used to stop taking opioids, such as methadone or buprenorphine Linezolid MAOIs, such as Carbex, Eldepryl, Marplan, Nardil, and Parnate Methylene blue (injected into a vein) Opioid medications Other medications that contain bupropion , such as Zyban  or Wellbutrin  This medication may also interact with the following: Alcohol Certain medications for blood pressure, such as metoprolol, propranolol Certain medications for depression, anxiety, or mental health conditions Certain medications for HIV or hepatitis Certain medications for irregular heart beat, such as propafenone, flecainide Certain medications for Parkinson disease, such as amantadine, levodopa Certain medications for seizures, such as carbamazepine, phenytoin, phenobarbital Certain medications for sleep Cimetidine Clopidogrel Cyclophosphamide Digoxin Disulfiram Furazolidone Isoniazid Nicotine Orphenadrine Procarbazine Steroid medications, such as prednisone  or cortisone Stimulant medications for attention disorders, weight loss, or to stay awake Tamoxifen Theophylline Thiotepa Ticlopidine Tramadol Warfarin This list may not describe all possible interactions. Give your health care provider a list of all the medicines, herbs, non-prescription drugs,  or dietary supplements you use. Also tell them if you  smoke, drink alcohol, or use illegal drugs. Some items may interact with your medicine. What should I watch for while using this medication? Visit your care team for regular checks on your progress. This medication may cause serious skin reactions. They can happen weeks to months after starting the medication. Contact your care team right away if you notice fevers or flu-like symptoms with a rash. The rash may be red or purple and then turn into blisters or peeling of the skin. Or, you might notice a red rash with swelling of the face, lips, or lymph nodes in your neck or under your arms. This medication may affect blood sugar. Ask your care team if changes in diet or medications are needed if you have diabetes. Patients and their families should watch out for new or worsening depression or thoughts of suicide. This includes sudden changes in mood, behaviors, or thoughts. These changes can happen at any time but are more common in the beginning of treatment or after a change in dose. Call your care team right away if you experience these thoughts or worsening depression. Avoid alcoholic drinks while taking this medication. Drinking large amounts of alcoholic beverages, using sleeping or anxiety medications, or quickly stopping the use of these agents while taking this medication may increase your risk for a seizure. Do not drive or use heavy machinery until you know how this medication affects you. This medication can impair your ability to perform these tasks. Inform your care team if you wish to become pregnant or think you might be pregnant. Losing weight while pregnant is not advised and may cause harm to the unborn child. Talk to your care team for more information. What side effects may I notice from receiving this medication? Side effects that you should report to your care team as soon as possible: Allergic reactions--skin rash, itching, hives, swelling of the face, lips, tongue, or throat Fast  or irregular heartbeat Increase in blood pressure Liver injury--right upper belly pain, loss of appetite, nausea, light-colored stool, dark yellow or brown urine, yellowing skin or eyes, unusual weakness or fatigue Mood and behavior changes--anxiety, nervousness, confusion, hallucinations, irritability, hostility, thoughts of suicide or self-harm, worsening mood, feelings of depression Redness, blistering, peeling, or loosening of the skin, including inside the mouth Seizures Sudden eye pain or change in vision such as blurry vision, seeing halos around lights, vision loss Side effects that usually do not require medical attention (report to your care team if they continue or are bothersome): Constipation Dizziness Dry mouth Fatigue Headache Nausea Trouble sleeping Vomiting This list may not describe all possible side effects. Call your doctor for medical advice about side effects. You may report side effects to FDA at 1-800-FDA-1088. Where should I keep my medication? Keep out of the reach of children and pets. Store between 15 and 30 degrees C (59 and 86 degrees F). Get rid of any unused medication after the expiration date. To get rid of medications that are no longer needed or have expired: Take the medication to a medication take-back program. Check with your pharmacy or law enforcement to find a location. If you cannot return the medication, check the label or package insert to see if the medication should be thrown out in the garbage or flushed down the toilet. If you are not sure, ask your care team. If it is safe to put it in the trash, pour the medication out of the  container. Mix the medication with cat litter, dirt, coffee grounds, or other unwanted substance. Seal the mixture in a bag or container. Put it in the trash. NOTE: This sheet is a summary. It may not cover all possible information. If you have questions about this medicine, talk to your doctor, pharmacist, or health care  provider.  2024 Elsevier/Gold Standard (2021-06-08 00:00:00)

## 2024-01-06 NOTE — Progress Notes (Signed)
 Established Patient Office Visit  Subjective   Patient ID: Darlene Melendez, female    DOB: 1971/02/09  Age: 53 y.o. MRN: [id number]  Chief Complaint  Patient presents with   Medical Management of Chronic Issues    HPI Darlene Melendez is a 53 y/o female with pmhx of migraines, prediabetes/T2DM, and hypertriglyceridemia presenting today to recheck labs and refill medications. She is doing well, no complaints today. She is requesting refills on Enalapril  and hydrochlorothiazide  which she denies any adverse effects on. Also requesting refill on fenofibrate .  Migraines are well managed on Rizatriptan . She reports about 1 migraine day a month.  Expresses concerns today about 5-6 pounds of weight gain in the last year and would like to start medication for weight loss. States she has not been able to get zepbound /mounjaro  due to cost.  She would like to lose weight but admits to doing minimal exercise, but she does eat a lot of fish and avoids sweets and fried foods.   ROS See HPI>    Objective:     BP 122/78   Pulse 74   Ht 5' 5 (1.651 m)   Wt 218 lb (98.9 kg)   LMP 04/05/2018   SpO2 96%   BMI 36.28 kg/m  BP Readings from Last 3 Encounters:  01/06/24 122/78  10/21/23 128/82  01/07/23 132/80   Wt Readings from Last 3 Encounters:  01/06/24 218 lb (98.9 kg)  10/21/23 218 lb 14.4 oz (99.3 kg)  01/14/23 215 lb (97.5 kg)      Physical Exam Constitutional:      Appearance: Normal appearance. She is obese.  HENT:     Head: Normocephalic.  Neck:     Vascular: No carotid bruit.  Cardiovascular:     Rate and Rhythm: Normal rate and regular rhythm.     Pulses: Normal pulses.     Heart sounds: Normal heart sounds.  Pulmonary:     Effort: Pulmonary effort is normal.     Breath sounds: Normal breath sounds.  Musculoskeletal:     Cervical back: Normal range of motion and neck supple.     Right lower leg: No edema.     Left lower leg: No edema.  Lymphadenopathy:     Cervical: No  cervical adenopathy.  Neurological:     General: No focal deficit present.     Mental Status: She is alert and oriented to person, place, and time.  Psychiatric:        Mood and Affect: Mood normal.      Results for orders placed or performed in visit on 01/06/24  POCT HgB A1C  Result Value Ref Range   Hemoglobin A1C 6.3 (A) 4.0 - 5.6 %   HbA1c POC (<> result, manual entry)     HbA1c, POC (prediabetic range)     HbA1c, POC (controlled diabetic range)    POCT UA - Microalbumin  Result Value Ref Range   Microalbumin Ur, POC 10 mg/L   Creatinine, POC 100 mg/dL   Albumin/Creatinine Ratio, Urine, POC <30       The 10-year ASCVD risk score (Arnett DK, et al., 2019) is: 2.9%    Assessment & Plan:  SABRASABRAJennife was seen today for medical management of chronic issues.  Diagnoses and all orders for this visit:  Hyperlipidemia associated with type 2 diabetes mellitus (HCC) -     POCT HgB A1C -     POCT UA - Microalbumin -     CMP14+EGFR -  metFORMIN  (GLUCOPHAGE ) 1000 MG tablet; Take 1 tablet (1,000 mg total) by mouth 2 (two) times daily with a meal.  Hypertriglyceridemia -     Lipid panel -     fenofibrate  (TRICOR ) 145 MG tablet; Take 1 tablet (145 mg total) by mouth daily.  Essential hypertension, benign -     CMP14+EGFR -     enalapril  (VASOTEC ) 10 MG tablet; Take 1 tablet (10 mg total) by mouth daily. -     hydrochlorothiazide  (HYDRODIURIL ) 25 MG tablet; Take 1 tablet (25 mg total) by mouth daily.  Medication management -     CMP14+EGFR -     TSH -     CBC with Differential/Platelet -     Lipid panel -     VITAMIN D  25 Hydroxy (Vit-D Deficiency, Fractures)  Class 2 severe obesity due to excess calories with serious comorbidity and body mass index (BMI) of 36.0 to 36.9 in adult (HCC) -     Naltrexone -buPROPion  HCl ER 8-90 MG TB12; 1 tab daily for week 1, then 1 tab BID for week 2, then 2 tab PO qAM and 1 tab PO qPM for week 3, then 2 tabs BID. -      Naltrexone -buPROPion  HCl ER 8-90 MG TB12; Take 2 tablets by mouth 2 (two) times daily.  Migraine without aura and without status migrainosus, not intractable   A1C in pre-diabetes range today with lifestyle changes Will add metformin  to help with elevated sugars and insulin resistance and potentially help with weight as well BP to goal, refilled hydrochlorothiazide  and Enalapril  On Tricor  for elevated TG.  Eye exam scheduled Will get flu shot at work Mammogram scheduled Fasting labs ordered today  .SABRADiscussed low carb diet with 1500 calories and 80g of protein.  Exercising at least 150 minutes a week.  [employer] could be a Chief Technology Officer.  Discussed medication options GLP-1 not affordable Trial of contrave  Discussed SE's and how to titrate up Follow up in 3 months  Continue as needed maxalt  for controlled migraines, no refill needed today  Return in about 3 months (around 04/06/2024).    Khyree Carillo, PA-C

## 2024-01-09 ENCOUNTER — Encounter: Payer: Self-pay | Admitting: Physician Assistant

## 2024-01-09 MED ORDER — FENOFIBRATE 145 MG PO TABS: 145.0000 mg | ORAL_TABLET | Freq: Every day | ORAL | 3 refills | Status: AC

## 2024-01-09 MED ORDER — HYDROCHLOROTHIAZIDE 25 MG PO TABS: 25.0000 mg | ORAL_TABLET | Freq: Every day | ORAL | 3 refills | Status: AC

## 2024-01-09 MED ORDER — ENALAPRIL MALEATE 10 MG PO TABS: 10.0000 mg | ORAL_TABLET | Freq: Every day | ORAL | 3 refills | Status: AC

## 2024-02-10 ENCOUNTER — Ambulatory Visit: Payer: Self-pay | Admitting: Physician Assistant

## 2024-02-10 LAB — CBC WITH DIFFERENTIAL/PLATELET
Basophils Absolute: 0 x10E3/uL (ref 0.0–0.2)
Basos: 1 %
EOS (ABSOLUTE): 0.1 x10E3/uL (ref 0.0–0.4)
Eos: 2 %
Hematocrit: 39.8 % (ref 34.0–46.6)
Hemoglobin: 13.3 g/dL (ref 11.1–15.9)
Immature Grans (Abs): 0 x10E3/uL (ref 0.0–0.1)
Immature Granulocytes: 0 %
Lymphocytes Absolute: 1.5 x10E3/uL (ref 0.7–3.1)
Lymphs: 38 %
MCH: 31.6 pg (ref 26.6–33.0)
MCHC: 33.4 g/dL (ref 31.5–35.7)
MCV: 95 fL (ref 79–97)
Monocytes Absolute: 0.4 x10E3/uL (ref 0.1–0.9)
Monocytes: 9 %
Neutrophils Absolute: 2.1 x10E3/uL (ref 1.4–7.0)
Neutrophils: 50 %
Platelets: 258 x10E3/uL (ref 150–450)
RBC: 4.21 x10E6/uL (ref 3.77–5.28)
RDW: 12.2 % (ref 11.7–15.4)
WBC: 4 x10E3/uL (ref 3.4–10.8)

## 2024-02-10 LAB — CMP14+EGFR
ALT: 36 IU/L — ABNORMAL HIGH (ref 0–32)
AST: 31 IU/L (ref 0–40)
Albumin: 5 g/dL — ABNORMAL HIGH (ref 3.8–4.9)
Alkaline Phosphatase: 58 IU/L (ref 49–135)
BUN/Creatinine Ratio: 15 (ref 9–23)
BUN: 15 mg/dL (ref 6–24)
Bilirubin Total: 0.4 mg/dL (ref 0.0–1.2)
CO2: 23 mmol/L (ref 20–29)
Calcium: 9.9 mg/dL (ref 8.7–10.2)
Chloride: 99 mmol/L (ref 96–106)
Creatinine, Ser: 0.98 mg/dL (ref 0.57–1.00)
Globulin, Total: 2.3 g/dL (ref 1.5–4.5)
Glucose: 107 mg/dL — ABNORMAL HIGH (ref 70–99)
Potassium: 4 mmol/L (ref 3.5–5.2)
Sodium: 140 mmol/L (ref 134–144)
Total Protein: 7.3 g/dL (ref 6.0–8.5)
eGFR: 68 mL/min/1.73 (ref >59)

## 2024-02-10 LAB — LIPID PANEL
Chol/HDL Ratio: 2.7 ratio (ref 0.0–4.4)
Cholesterol, Total: 184 mg/dL (ref 100–199)
HDL: 67 mg/dL (ref >39)
LDL Chol Calc (NIH): 100 mg/dL — ABNORMAL HIGH (ref 0–99)
Triglycerides: 92 mg/dL (ref 0–149)
VLDL Cholesterol Cal: 17 mg/dL (ref 5–40)

## 2024-02-10 LAB — TSH: TSH: 1.89 u[IU]/mL (ref 0.450–4.500)

## 2024-02-10 LAB — VITAMIN D 25 HYDROXY (VIT D DEFICIENCY, FRACTURES): Vit D, 25-Hydroxy: 92.9 ng/mL (ref 30.0–100.0)

## 2024-02-10 NOTE — Progress Notes (Signed)
 Darlene Melendez,   Thyroid looks good.  Vitamin D  looks good.  LDL not to goal of under 70. Would you be willing to take low dose cholesterol medication to help you get to goal and decrease your cardiovascular risk.

## 2024-02-14 MED ORDER — ROSUVASTATIN CALCIUM 5 MG PO TABS: 5.0000 mg | ORAL_TABLET | Freq: Every day | ORAL | 3 refills | Status: AC

## 2024-02-14 NOTE — Progress Notes (Signed)
 Sent crestor 5mg  to take at bedtime to get LDL to 70 or under. Recheck lipid panel in 6 months.

## 2024-04-04 ENCOUNTER — Ambulatory Visit (INDEPENDENT_AMBULATORY_CARE_PROVIDER_SITE_OTHER): Admitting: Physician Assistant

## 2024-04-04 VITALS — BP 137/67 | HR 66 | Ht 65.0 in | Wt 197.7 lb

## 2024-04-04 DIAGNOSIS — H9313 Tinnitus, bilateral: Secondary | ICD-10-CM | POA: Diagnosis not present

## 2024-04-04 DIAGNOSIS — E66811 Obesity, class 1: Secondary | ICD-10-CM | POA: Diagnosis not present

## 2024-04-04 DIAGNOSIS — E785 Hyperlipidemia, unspecified: Secondary | ICD-10-CM

## 2024-04-04 DIAGNOSIS — E6609 Other obesity due to excess calories: Secondary | ICD-10-CM | POA: Diagnosis not present

## 2024-04-04 DIAGNOSIS — E1169 Type 2 diabetes mellitus with other specified complication: Secondary | ICD-10-CM

## 2024-04-04 DIAGNOSIS — Z6832 Body mass index (BMI) 32.0-32.9, adult: Secondary | ICD-10-CM | POA: Diagnosis not present

## 2024-04-04 DIAGNOSIS — I10 Essential (primary) hypertension: Secondary | ICD-10-CM | POA: Diagnosis not present

## 2024-04-04 DIAGNOSIS — E781 Pure hyperglyceridemia: Secondary | ICD-10-CM

## 2024-04-04 LAB — POCT GLYCOSYLATED HEMOGLOBIN (HGB A1C): Hemoglobin A1C: 5.5 % (ref 4.0–5.6)

## 2024-04-04 NOTE — Patient Instructions (Signed)
 Tinnitus Tinnitus is when you hear a sound that there's no actual source for. It may sound like ringing in your ears or something else. The sound may be loud, soft, or somewhere in between. It can last for a few seconds or be constant for days. It can come and go. Almost everyone has tinnitus at some point. It's not the same as hearing loss. But you may need to see a health care provider if: It lasts for a long time. It comes back often. You have trouble sleeping and focusing. What are the causes? The cause of tinnitus is often unknown. In some cases, you may get it if: You're around loud noises, such as from machines or music. An object gets stuck in your ear. Earwax builds up in Landscape [employer]. You drink a lot of alcohol or caffeine. You take certain medicines. You start to lose your hearing. You may also get it from some medical conditions. These may include: Ear or sinus infections. Heart diseases. High blood pressure. Allergies. Mnire's disease. Problems with your thyroid. A tumor. This is a growth of cells that isn't normal. A weak, bulging blood vessel called an aneurysm near your ear. What increases the risk? You may be more likely to get tinnitus if: You're around loud noises a lot. You're older. You drink alcohol. You smoke. What are the signs or symptoms? The main symptom is hearing a sound that there's no source for. It may sound like ringing. It may also sound like: Buzzing. Sizzling. Blowing air. Hissing. Whistling. Other sounds may include: Roaring. Running water. A musical note. Tapping. Humming. You may have symptoms in one ear or both ears. How is this diagnosed? Tinnitus is diagnosed based on your symptoms, your medical history, and an exam. Your provider may do a full hearing test if your tinnitus: Is in just one ear. Makes it hard for you to hear. Lasts 6 months or longer. You may also need to see an expert in hearing disorders called an audiologist.  They may ask you about your symptoms and how tinnitus affects your daily life. You may have other tests done. These may include: A CT scan. An MRI. An angiogram. This shows how blood flows through your blood vessels. How is this treated? Treatment may include: Therapy to help you manage the stress of living with tinnitus. Finding ways to mask or cover the sound of tinnitus. These include: Sound or white noise machines. Devices that fit in your ear and play sounds or music. A loud humidifier. Acoustic neural stimulation. This is when you use headphones to listen to music that has a special signal in it. Over time, this signal may change some of the pathways in your brain. This can make you less sensitive to tinnitus. This treatment is used for very severe cases. Using hearing aids or cochlear implants if your tinnitus is from hearing loss. If your tinnitus is caused by a medical condition, treating the condition may make it go away.  Follow these instructions at home: Managing symptoms     Try to avoid being in loud places or around loud noises. Wear earplugs or headphones when you're around loud noises. Find ways to reduce stress. These may include meditation, yoga, or deep breathing. Sleep with your head slightly raised. General instructions Take over-the-counter and prescription medicines only as told by your provider. Track the things that cause symptoms (triggers). Try to avoid these things. To stop your tinnitus from getting worse: Do not drink alcohol. Do  not have caffeine. Do not use any products that contain nicotine or tobacco. These products include cigarettes, chewing tobacco, and vaping devices, such as e-cigarettes. If you need help quitting, ask your provider. Avoid using too much salt. Get enough sleep each night. Where to find more information American Tinn[employer] Association: https://www.johnson-hamilton.org/ Contact a health care provider if: Your symptoms last for 3 weeks or longer without  stopping. You have sudden hearing loss. Your symptoms get worse or don't get better with home care. You can't manage the stress of living with tinnitus. Get help right away if: You get tinnitus after a head injury. You have tinnitus and: Dizziness. Nausea and vomiting. Loss of balance. A sudden, severe headache. Changes to your eyesight. Weakness in your face, arms, or legs. These symptoms may be an emergency. Get help right away. Call 911. Do not wait to see if the symptoms will go away. Do not drive yourself to the hospital. This information is not intended to replace advice given to you by your health care provider. Make sure you discuss any questions you have with your health care provider. Document Revised: 07/19/2022 Document Reviewed: 07/19/2022 Elsevier Patient Education  2024 [employer].

## 2024-04-04 NOTE — Progress Notes (Signed)
 Established Patient Office Visit  Subjective   Patient ID: Darlene Melendez, female    DOB: 26-Oct-1970  Age: 53 y.o. MRN: [id number]  Chief Complaint  Patient presents with   Medical Management of Chronic Issues    HPI .SABRADiscussed the use of AI scribe software for clinical note transcription with the patient, who gave verbal consent to proceed.  History of Present Illness The patient presents to the clinic to follow up on T2DM, weight management.   Weight management and glycemic control - Weight loss of 23lbs pounds since last visit - Decrease in hemoglobin A1c from 6.3 to 5.5 - Currently taking Contrave  twice daily and metformin  - No regular exercise - Occasional shakiness, managed without intervention - Medications taken in the morning and evening without food - Not checking sugars  Tinnitus and ear symptoms - Constant ringing in the ears, recently worsened from nighttime-only to persistent throughout the day - No hearing loss - No dizziness - No changes in medication during this period - Uses a decongestant - Mild nasal congestion and stuffiness onset one day prior to visit - ongoing symptoms for a while just not mentioned it   ROS See HPI.    Objective:     BP 137/67   Pulse 66   Ht 5' 5 (1.651 m)   Wt 197 lb 11.2 oz (89.7 kg)   LMP 04/05/2018   SpO2 99%   BMI 32.90 kg/m  BP Readings from Last 3 Encounters:  04/04/24 137/67  01/06/24 122/78  10/21/23 128/82   Wt Readings from Last 3 Encounters:  04/04/24 197 lb 11.2 oz (89.7 kg)  01/06/24 218 lb (98.9 kg)  10/21/23 218 lb 14.4 oz (99.3 kg)      Physical Exam Constitutional:      Appearance: Normal appearance. She is obese.  HENT:     Head: Normocephalic.     Right Ear: Tympanic membrane, ear canal and external ear normal. There is no impacted cerumen.     Left Ear: Tympanic membrane, ear canal and external ear normal. There is no impacted cerumen.  Cardiovascular:     Rate and Rhythm:  Normal rate and regular rhythm.  Pulmonary:     Effort: Pulmonary effort is normal.     Breath sounds: Normal breath sounds.  Musculoskeletal:     Right lower leg: No edema.     Left lower leg: No edema.  Neurological:     General: No focal deficit present.     Mental Status: She is alert.  Psychiatric:        Mood and Affect: Mood normal.      Results for orders placed or performed in visit on 04/04/24  POCT HgB A1C  Result Value Ref Range   Hemoglobin A1C 5.5 4.0 - 5.6 %   HbA1c POC (<> result, manual entry)     HbA1c, POC (prediabetic range)     HbA1c, POC (controlled diabetic range)        The 10-year ASCVD risk score (Arnett DK, et al., 2019) is: 4.5%    Assessment & Plan:  SABRASABRALuticia was seen today for medical management of chronic issues.  Diagnoses and all orders for this visit:  Class 1 obesity due to excess calories with serious comorbidity and body mass index (BMI) of 32.0 to 32.9 in adult  Hypertriglyceridemia  Essential hypertension, benign  Hyperlipidemia associated with type 2 diabetes mellitus (HCC) -     POCT HgB A1C  Bilateral nonpulsatile tinnitus  Assessment & Plan Type 2 diabetes mellitus associated with hyperlipidemia Improved glycemic control with decreased Hemoglobin A1c. Managed with Contrave  and metformin . Mild shakiness noted, possibly related to medication timing. - Continue Contrave  twice daily. - Continue metformin . - Encouraged dietary changes and increased physical activity. - Schedule eye exam - Decline all vaccines  Class 1 obesity Recent weight loss of 23lbs pounds. Managed with Contrave  and dietary changes. Mild shakiness noted, possibly related to medication timing. - Continue Contrave  twice daily. - Encouraged dietary changes and increased physical activity.  Tinnitus Bilateral tinnitus with increased severity. No hearing loss, dizziness, or nausea. Possible contributing factors include allergies or dehydration. ENT  referral considered if red flags develop or persistent or worsening.  - Provided handout on tinnitus management. - Consider increasing salt intake. - Try Flonase for ear pressure regulation. - Treatment of allergy  symptoms with zyrtec D - Look for triggers. - Monitor for red flags such as hearing loss or dizziness. - HO given.      Return in about 6 months (around 10/03/2024).    Angelic Schnelle, PA-C

## 2024-04-06 ENCOUNTER — Ambulatory Visit: Admitting: Physician Assistant

## 2024-05-24 ENCOUNTER — Other Ambulatory Visit: Payer: Self-pay | Admitting: Physician Assistant

## 2024-05-24 DIAGNOSIS — E66812 Obesity, class 2: Secondary | ICD-10-CM

## 2024-10-19 ENCOUNTER — Ambulatory Visit: Admitting: Allergy
